# Patient Record
Sex: Female | Born: 1940 | Race: White | Hispanic: No | Smoking: Current every day smoker
Health system: Southern US, Community
[De-identification: ages and names within clinical notes are randomized; demographics above are authoritative.]

## PROBLEM LIST (undated history)

## (undated) DIAGNOSIS — E785 Hyperlipidemia, unspecified: Secondary | ICD-10-CM

## (undated) DIAGNOSIS — F329 Major depressive disorder, single episode, unspecified: Secondary | ICD-10-CM

## (undated) DIAGNOSIS — I2510 Atherosclerotic heart disease of native coronary artery without angina pectoris: Secondary | ICD-10-CM

## (undated) DIAGNOSIS — L02512 Cutaneous abscess of left hand: Secondary | ICD-10-CM

## (undated) DIAGNOSIS — E876 Hypokalemia: Secondary | ICD-10-CM

## (undated) DIAGNOSIS — I10 Essential (primary) hypertension: Secondary | ICD-10-CM

## (undated) DIAGNOSIS — F419 Anxiety disorder, unspecified: Secondary | ICD-10-CM

## (undated) DIAGNOSIS — R918 Other nonspecific abnormal finding of lung field: Secondary | ICD-10-CM

## (undated) DIAGNOSIS — F32A Depression, unspecified: Secondary | ICD-10-CM

## (undated) DIAGNOSIS — K219 Gastro-esophageal reflux disease without esophagitis: Secondary | ICD-10-CM

## (undated) DIAGNOSIS — Z8249 Family history of ischemic heart disease and other diseases of the circulatory system: Secondary | ICD-10-CM

## (undated) DIAGNOSIS — J95811 Postprocedural pneumothorax: Secondary | ICD-10-CM

## (undated) DIAGNOSIS — I272 Pulmonary hypertension, unspecified: Secondary | ICD-10-CM

## (undated) DIAGNOSIS — R3129 Other microscopic hematuria: Secondary | ICD-10-CM

## (undated) DIAGNOSIS — E039 Hypothyroidism, unspecified: Secondary | ICD-10-CM

## (undated) DIAGNOSIS — J449 Chronic obstructive pulmonary disease, unspecified: Secondary | ICD-10-CM

## (undated) HISTORY — DX: Depression, unspecified: F32.A

## (undated) HISTORY — DX: Chronic obstructive pulmonary disease, unspecified: J44.9

## (undated) HISTORY — DX: Gastro-esophageal reflux disease without esophagitis: K21.9

## (undated) HISTORY — DX: Cutaneous abscess of left hand: L02.512

## (undated) HISTORY — DX: Major depressive disorder, single episode, unspecified: F32.9

## (undated) HISTORY — DX: Hypothyroidism, unspecified: E03.9

## (undated) HISTORY — DX: Atherosclerotic heart disease of native coronary artery without angina pectoris: I25.10

## (undated) HISTORY — DX: Family history of ischemic heart disease and other diseases of the circulatory system: Z82.49

## (undated) HISTORY — DX: Other microscopic hematuria: R31.29

## (undated) HISTORY — PX: CORONARY ARTERY BYPASS GRAFT: SHX141

## (undated) HISTORY — DX: Other nonspecific abnormal finding of lung field: R91.8

## (undated) HISTORY — DX: Hyperlipidemia, unspecified: E78.5

## (undated) HISTORY — DX: Hypokalemia: E87.6

## (undated) HISTORY — DX: Essential (primary) hypertension: I10

## (undated) HISTORY — DX: Pulmonary hypertension, unspecified: I27.20

## (undated) HISTORY — PX: ABDOMINAL HYSTERECTOMY: SHX81

## (undated) HISTORY — DX: Anxiety disorder, unspecified: F41.9

---

## 2005-06-04 ENCOUNTER — Ambulatory Visit: Payer: Self-pay | Admitting: Pulmonary Disease

## 2005-06-04 ENCOUNTER — Inpatient Hospital Stay (HOSPITAL_COMMUNITY): Admission: AD | Admit: 2005-06-04 | Discharge: 2005-06-06 | Payer: Self-pay | Admitting: Neurosurgery

## 2005-06-20 ENCOUNTER — Ambulatory Visit: Payer: Self-pay | Admitting: Pulmonary Disease

## 2005-08-20 ENCOUNTER — Ambulatory Visit: Payer: Self-pay | Admitting: Pulmonary Disease

## 2005-11-21 ENCOUNTER — Ambulatory Visit: Payer: Self-pay | Admitting: Pulmonary Disease

## 2009-06-21 ENCOUNTER — Telehealth (INDEPENDENT_AMBULATORY_CARE_PROVIDER_SITE_OTHER): Payer: Self-pay | Admitting: *Deleted

## 2010-03-06 NOTE — Progress Notes (Signed)
  Phone Note Other Incoming   Request: Send information Summary of Call: Request for records received from [facility]. Request forwarded to [facility].

## 2010-06-22 NOTE — Assessment & Plan Note (Signed)
[facility]                               PULMONARY OFFICE NOTE   [name], BASIC                      MRN:          [id number]  DATE:11/21/2005                            DOB:          1940-08-07    HISTORY OF PRESENT ILLNESS:  I saw Casey Moran today in followup for her  chronic obstructive pulmonary disease, obstructive sleep apnea, and  secondary pulmonary hypertension. She says that since her last visit with  me, she had been evaluated for cause of anemia. She had undergone upper  endoscopy as well as colonoscopy 3 days prior to this visit and apparently  was found to have an oozing vessel from her stomach, although she is due to  have followup with her gastroenterologist for further discussion of this.  She has also been started on a Holter monitor for evaluation of irregular  heartbeat. She says that her breathing has been significantly worse for the  last several days, although today it seems to be getting better. She says  that she had run out of several of her medications and was waiting for her  monthly check to come in before renewing her medications and as a result of  that, she was not using her inhaler regimen as prescribed. Otherwise, again  she says that she is feeling reasonably good today with her breathing. She  is using her supplemental oxygen at night and she is using her C-PAP machine  without any difficulty. She says that when she tries using the Foradil twice  a day, every day, she felt like she had difficulty being able to fall  asleep. But she has tried using it once daily, every other day, and then  twice a day every other day and this seems to allow her to sleep a little  bit better.   CURRENT MEDICATIONS:  1. Xanax 0.5 mg q.h.s.  2. Aspirin 81 mg daily.  3. Cenestin 0.625 mg daily.  4. Fluoxetine 40 mg daily.  5. Levothyroxine 80 mcg daily.  6. Omeprazole 20 mg b.i.d.  7. Plavix 75 mg daily.  8. Simvastatin  80 mg daily.  9. Spiriva 1 puff daily.  10.Toprol XL 50 mg daily.  11.Zetia 10 mg daily.  12.Foradil inhaler b.i.d.  13.Albuterol 2 puffs twice daily.  14.Cyclobenzaprine 10 mg p.r.n.  15.Hydrocodone APAP 5/500 as needed.   PHYSICAL EXAMINATION:  VITAL SIGNS:  Weight 165 pounds. Temperature 97.9.  Blood pressure 104/62. Heart rate 76. Oxygen saturation 94% on room air.  HEENT:  No sinus tenderness. No nasal discharge. No oral lesions. No  lymphadenopathy.  HEART:  S1 and S2.  CHEST:  Decreased breath sounds but no wheezing or rales.  ABDOMEN:  Soft, nontender, and nondistended.  EXTREMITIES:  No edema.   IMPRESSION:  1. Chronic obstructive pulmonary disease. I will continue her on her      Spiriva. I will discontinue the Foradil and change her over to      Symbicort 80/4.5 two puffs b.i.d. I have demonstrated the proper use of      this inhaler. I  will also continue her on her albuterol as needed. We      have given her a sample of Symbicort.  2. Obstructive sleep apnea. I will continue her on her current setting of      C-PAP with 8 cm of water, as she appears to be doing reasonably well      with this.  3. Secondary pulmonary hypertension. I will continue to try and optimize      treatment of her chronic obstructive pulmonary disease and obstructive      sleep apnea as well as continue her on her supplement oxygen.  4. I have given her an influenza vaccination today.  5. Recent episode of dyspnea. This could be related to her missing several      days of her medications as well as possibly related to her anemia.      Again, she is due to have followup with her gastroenterologist for      further evaluation of this.  6. Irregular heart rate. She is undergoing a Holter monitor and is due to      have followup with cardiology for this.   FOLLOWUP:  My plan is to have her come back for followup evaluation in 3 to  4 months.       Coralyn Helling, MD      VS/MedQ  DD:   11/21/2005  DT:  11/24/2005  Job #:  [id number]

## 2010-06-22 NOTE — Assessment & Plan Note (Signed)
[facility]                               PULMONARY OFFICE NOTE   Casey Moran, Casey Moran                      MRN:          [id number]  DATE:08/20/2005                            DOB:          [birth year]    I saw Casey Moran today in followup for her COPD and sleep apnea with  secondary pulmonary hypertension.  She is doing quite well symptomatically.  She is on an inhaled regimen of Spiriva with p.r.n. albuterol.  She is using  her albuterol 4 to 5 times a week but she feels like she needs to use it  more often, but is somewhat reluctant to do that.  Otherwise she says that  she is able to walk on her treadmill for about 20 minutes.  She is trying to  reach a goal of doing a mile to a mile and a half a day.  She does not have  much as far as symptoms of dyspnea while doing this.  She said that she has  been having occasional symptoms of chest congestion with a cough more  recently with the increasing humidity in the air.  She has also been having  occasional symptoms of palpitations.  She does not have any other symptoms  to correlate with this.  She seems to be doing quite well on her CPAP  therapy and has no difficulty wearing this.  She is wearing this for the  entire time that she is asleep.  I have also received a copy of her medical  records from  Dr. Danella Penton and of note that she had pulmonary function tests  done on August 18, 2004 and this showed the FV1/FVC ratio 55% with a FV1 of  2.03 liters which was 81% of predicted, and FVC of 3.68 liters which is 115%  of predicted.  She had a total lung capacity of 6.29 liters which was 121%  of predicted and the residual volume of 2.6 liters which was 127% of  predicted.  Diffusion capacity of 64% of predicted.  Her baseline  flow  volume looks like she did have some degree of air flow obstruction, although  this was somewhat mild.  However, I do not see any mention of her having  pulmonary hypertension.   She has not had any significant changes in her  medications since her last visit.   PHYSICAL EXAMINATION:  VITAL SIGNS:  She is 164 pounds.  Temperature 97.9.  Blood pressure 120/78.  Heart rate is 78.  Oxygen saturation is 92%on room  air.  HEENT:  There is no sinus tenderness.  No nasal discharge.  No oral lesions.  No lymphadenopathy, no thyromegaly.  HEART:  S1 and S2.  Regular rate and rhythm.  CHEST:  Clear to auscultation.  ABDOMEN:  Soft, non-tender.  Positive bowel sounds.  EXTREMITIES:  No edema.   IMPRESSION:  1.  Chronic obstructive pulmonary disease. I will continue her on Spiriva      and in addition I will start her on Foradil 1 puff b.i.d. to see if this  helps with better symptom control.  I also advised her that she did not      need to use her Combivent anymore and that she could use albuterol on an      as needed basis as a rescue medicine.  Additionally I will have her      undergo an overnight oximetry to see if she does in fact need continued      use of nocturnal oxygen.  2.  Obstructive sleep apnea - currently doing well on continuous positive      airway pressure of 8 cm of water.  I have encouraged her to maintain her      compliance with this and will continue her on her current settings.  3.  Possible secondary pulmonary hypertension.  I have asked her to forward      copies of her most recent cardiac catheterization and echocardiogram to      further assess this.  Additionally I will have her undergo an overnight      oximetry with the main goal being to determine if she in fact needs to      remain on supplemental oxygen.  4.  I plan on calling her with the results of the overnight oximetry as well      as assessing her tolerance of Foradil.  I plan on following up with her      in the office in about three months.                                   Coralyn Helling, MD   VS/MedQ  DD:  08/20/2005  DT:  08/20/2005  Job #:  [id number]

## 2010-06-22 NOTE — Discharge Summary (Signed)
Casey Moran, Casey Moran               ACCOUNT NO.:  [id number]   MEDICAL RECORD NO.:  [id number]          PATIENT TYPE:  INP   LOCATION:  3001                         FACILITY:  [facility]   PHYSICIAN:  Danae Orleans. Venetia Maxon, M.D.  DATE OF BIRTH:  April 09, 1940   DATE OF ADMISSION:  06/04/2005  DATE OF DISCHARGE:  06/06/2005                                 DISCHARGE SUMMARY   DATE OF ADMISSION:  Jun 04, 2005   DATE OF DISCHARGE:  Jun 06, 2005   REASON FOR ADMISSION:  1. Lumbar disc displacement.  2. Chronic airway obstructive disease.  3. Lumbar disc degeneration.  4. Lumbosacral spondylosis.  5. Coronary arthrosclerosis.  6. Aortic coronary bypass.  7. History of PENICILLIN ALLERGY.  8. History of SULFONAMIDE ALLERGY.  9. Obstructive sleep apnea.  10.History of tobacco use.  11.Esophageal reflux.  12.Chronic pulmonary heart disease.  13.Long term use of aspirin,  14.Long term use of antiplatelet and antithrombotic agents.   FINAL DIAGNOSES:  1. Lumbar disc displacement.  2. of destructive disease.  3. Lumbar disc degeneration.  4. Lumbosacral spondylosis.  5. Coronary arthrosclerosis.  6. Aortic coronary bypass.  7. History of PENICILLIN ALLERGY.  8. History of  ALLERGY.  9. Obstructive sleep apnea.  10.History of tobacco use.  11.Esophageal reflux.  12.Chronic pulmonary heart disease.  13.Long term use of aspirin,  14.Long term use of antiplatelet and antithrombotic agents.   HISTORY OF ILLNESS AND THE HOSPITAL COURSE:  Casey Moran is a 70 year old  woman with 10/10 pain in her left leg, which she describes as very severe.  This began in September of 2006.  I saw her in the office on May 24, 2005.  She was found to have a large extruded fragment of herniated disc at L4-5 on  the left and it was elected to admit her to the hospital for left L4-5  microdiskectomy, which procedure was performed on Jun 04, 2005.  Postoperatively, she had full strength in her lower extremities, was  gradually mobilized, but had wheezing and 93% room air saturation.  She was  seen by the critical care medicine service because of her COPD and poor  oxygen saturation on room air of 85%.  She was doing better on the 23rd with  discharge home on that with home CPAP, continuing her Spiriva, albuterol as  needed with instructions to follow up with a pulmonologist on Jun 20, 2005.  She will follow up with me in the office in 3-4 weeks.      Danae Orleans. Venetia Maxon, M.D.  Electronically Signed     JDS/MEDQ  D:  09/26/2005  T:  09/27/2005  Job:  [id number]

## 2010-06-22 NOTE — Op Note (Signed)
NAMEBRIENA, Casey Moran               ACCOUNT NO.:  [id number]   MEDICAL RECORD NO.:  [id number]          PATIENT TYPE:  AMB   LOCATION:  SDS                          FACILITY:  [facility]   PHYSICIAN:  Danae Orleans. Venetia Maxon, M.D.  DATE OF BIRTH:  1940/12/04   DATE OF PROCEDURE:  06/04/2005  DATE OF DISCHARGE:                                 OPERATIVE REPORT   PREOPERATIVE DIAGNOSIS:  Left L4-5 herniated disk with spondylosis,  degenerative disk disease, and radiculopathy.   POSTOPERATIVE DIAGNOSIS:  Left L4-5 herniated disk with spondylosis,  degenerative disk disease, and radiculopathy.   PROCEDURE:  Left L4-5 microdiskectomy with microdissection.   SURGEON:  Danae Orleans. Venetia Maxon, M.D.   ASSISTANT:  Coletta Memos, M.D.   ANESTHESIA:  General endotracheal anesthesia.   ESTIMATED BLOOD LOSS:  Minimal.   COMPLICATIONS:  None.   DISPOSITION:  To recovery room.   INDICATIONS FOR PROCEDURE:  Casey Moran is a 70 year old woman with left  leg pain, low back pain, and a large disk herniation at the L4-5 level.  It  was elected to take her to surgery for microdiskectomy.   DESCRIPTION OF PROCEDURE:  [name] was brought to the operating room.  Following the satisfactory and uncomplicated induction of general  endotracheal anesthesia and placement of intravenous lines, the patient was  placed in a prone position on the operating table on the Wilson frame.  Her  back was prepped and draped in the usual sterile fashion after area of plain  incision was infiltrated with 0.25% Marcaine and 0.5% lidocaine with  1:200,000 epinephrine.  Incision was made in the midline and carried through  adipose.  The lumbodorsal fascia was incised in the left side of the  midline.  Subperiosteal dissection was performed exposing the L4-5  interspace.  Self-retaining retractor was placed to facilitate exposure.  Intraoperative x-ray confirmed marker probe at the L4-5 level.  Subsequently, hemisemilaminectomy of L4  was performed with the high speed  drill and completed with Kerrison rongeurs and a foraminotomy was performed  overlying the superior aspect of the L5 lamina and over the foraminotomy  overlying the L5 nerve root.  Microscope was brought into the field and  using microscopic visualization and microdissection technique, the thecal  sac and L5 nerve root were mobilized medially.  A large free fragment of  herniated disk material was identified and this was removed in one large  piece, measuring approximately 3 cm in length x 1.5 cm in width from under  the nerve root and thecal sac.  Two additional much smaller pieces were also  removed and the floor of the canal was palpated.  The annulus appeared to be  intact and consequently was not incised and the medial aspect of the canal  also appeared to be well decompressed.  There did not appear to be any  residual compression under the L5 nerve root or displacing this nerve root.  Hemostasis was assured with Gelfoam soaked in thrombin and subsequently the  operative site was bathed in 2 mL of Fentanyl and 80 mg of Depo-Medrol.  The  self-retaining retractor  was removed, the microscope was taken out of the  field, lumbodorsal fascia was closed with 0 Vicryl suture.  The subcutaneous  tissues were reapproximated with 2-0 Vicryl interrupted inverted sutures and  the skin edges were reapproximated with interrupted 3-0 Vicryl subcuticular  stitch.  The wound was dressed with Dermabond.  The patient was extubated in  the operating room and taken to the recovery room in stable satisfactory  condition having tolerated the operation well.  Needle, sponge, and  instrument counts correct at the end of the case.      Danae Orleans. Venetia Maxon, M.D.  Electronically Signed     JDS/MEDQ  D:  06/04/2005  T:  06/05/2005  Job:  [id number]

## 2016-05-07 ENCOUNTER — Other Ambulatory Visit: Payer: Self-pay | Admitting: *Deleted

## 2016-05-07 ENCOUNTER — Institutional Professional Consult (permissible substitution) (INDEPENDENT_AMBULATORY_CARE_PROVIDER_SITE_OTHER): Payer: Medicare Other | Admitting: Cardiothoracic Surgery

## 2016-05-07 VITALS — BP 129/62 | HR 79 | Resp 20 | Ht 65.5 in | Wt 128.0 lb

## 2016-05-07 DIAGNOSIS — J984 Other disorders of lung: Secondary | ICD-10-CM | POA: Diagnosis not present

## 2016-05-07 DIAGNOSIS — R918 Other nonspecific abnormal finding of lung field: Secondary | ICD-10-CM

## 2016-05-07 NOTE — Progress Notes (Signed)
[address]       [place] [address]             [phone]                    Casey Moran [facility] Medical Record #[id number] Date of Birth: September 09, 1940  Referring: Bethanie Dicker, MD Primary Care: Elijio Miles, MD  Chief Complaint:    Chief Complaint  Patient presents with  . Lung Mass    Sugical eval, PET Scan 04/12/16, Chest CT 03/26/16, MRI Brain 04/15/16    History of Present Illness:    Casey Moran 76 y.o. female is seen in the office  today for valuation of left upper lobe lung lesion. Patient had previously had a lung cancer screening CT done at [facility] 10/20/2014, reported at that time was negative lung cancer screening CT evidence of emphysema. 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 Pricilla Holm in 2000. She was being evaluated for tinnitus and unsteadiness on her feet. A chest x-ray demonstrated questionable left lung mass a CT scan done February 26 03/26/2016 at [facility] showed a new irregular parenchymal opacity left upper lobe measuring 3.7 x 2.1 x 1.8 cm. A PET scan was performed and the patient referred to thoracic surgery for evaluation.    Current Activity/ Functional Status:  Patient is independent with mobility/ambulation, transfers, ADL's, IADL's.   Zubrod Score: At the time of surgery this patient's most appropriate activity status/level should be described as: []     0    Normal activity, no symptoms [x]     1    Restricted in physical strenuous activity but ambulatory, able to do out light work []     2    Ambulatory and capable of self care, unable to do work activities, up and about               >50 % of waking hours                              []     3    Only limited self care, in bed greater than 50% of waking hours []     4    Completely disabled, no self care, confined to bed or chair []     5    Moribund   Past  Medical History:  Diagnosis Date  . Abscess of finger of left hand   . Anxiety   . ASCVD (arteriosclerotic cardiovascular disease)   . CAD (coronary artery disease)   . COPD (chronic obstructive pulmonary disease) (HCC)   . Depression   . Family history of hereditary hemorrhagic telangiectasia (HHT)   . GERD (gastroesophageal reflux disease)   . Hematuria, microscopic   . Hyperlipemia   . Hypertension   . Hypokalemia   . Hypothyroidism   . Lung mass   . Pulmonary hypertension     Past Surgical History:  Procedure Laterality Date  . ABDOMINAL HYSTERECTOMY    . CORONARY ARTERY BYPASS GRAFT      cardiology/Dr Roarbach     No family history on file.  Social History   Social History  . Marital status: Widowed    Spouse name: N/A  . Number of children: N/A  . Years of education: N/A   Occupational History  . Not  on file.   Social History Main Topics  . Smoking status: Current Every Day Smoker    Types: Cigarettes  . Smokeless tobacco: Never Used  . Alcohol use No  . Drug use: No  . Sexual activity: No   Other Topics Concern  . Not on file   Social History Narrative  . No narrative on file    History  Smoking Status  . Current Every Day Smoker  . Types: Cigarettes  Smokeless Tobacco  . Never Used    History  Alcohol Use No     Allergies  Allergen Reactions  . Bupropion Tinitus  . Opium Palpitations and Other (See Comments)    Increased heart rate  . Azithromycin Rash  . Ciprofloxacin Other (See Comments)    Bruising  . Penicillins   . Sulfa Antibiotics   . Amoxicillin Rash  . Clindamycin/Lincomycin Rash  . Codeine Phosphate [Codeine] Rash  . Penicillin G Rash  . Sulfamethoxazole Rash    Current Outpatient Prescriptions  Medication Sig Dispense Refill  . clopidogrel (PLAVIX) 75 MG tablet TAKE 1 TABLET BY MOUTH DAILY    . levothyroxine (SYNTHROID, LEVOTHROID) 88 MCG tablet TAKE 1 TABLET EVERY DAY  AT  6AM    . lubiprostone  (AMITIZA) 24 MCG capsule Take by mouth.    . nitroGLYCERIN (NITROSTAT) 0.4 MG SL tablet PLACE ONE TABLET UNDER THE TONGUE EVERY FIVE MINUTES AS NEEDED FOR CHEST PAIN AS DIRECTED    . Tiotropium Bromide Monohydrate (SPIRIVA RESPIMAT) 2.5 MCG/ACT AERS Inhale into the lungs.    Marland Kitchen acetaminophen (TYLENOL) 325 MG tablet Take 325 mg by mouth.    Marland Kitchen albuterol (PROVENTIL HFA;VENTOLIN HFA) 108 (90 Base) MCG/ACT inhaler Inhale into the lungs.    . ALPRAZolam (XANAX) 1 MG tablet     . aspirin EC 81 MG tablet Take 81 mg by mouth.    Marland Kitchen atorvastatin (LIPITOR) 80 MG tablet Take by mouth.    . budesonide-formoterol (SYMBICORT) 160-4.5 MCG/ACT inhaler Inhale into the lungs.    . Calcium Carb-Ergocalciferol 500-200 MG-UNIT TABS Take by mouth.    . Cholecalciferol (VITAMIN D3) 1000 units CAPS Take by mouth.    . cyclobenzaprine (FLEXERIL) 10 MG tablet Take by mouth.    Marland Kitchen FLUoxetine (PROZAC) 40 MG capsule Take by mouth.    . levocetirizine (XYZAL) 5 MG tablet Take 5 mg by mouth.    . Levothyroxine Sodium 88 MCG CAPS Take by mouth.    . metoprolol succinate (TOPROL-XL) 50 MG 24 hr tablet     . omeprazole (PRILOSEC) 40 MG capsule     . tiZANidine (ZANAFLEX) 4 MG tablet      No current facility-administered medications for this visit.       Review of Systems:     Cardiac Review of Systems: Y or N  Chest Pain [  n  ]  Resting SOB [ n  ] Exertional SOB  [ y ]  Orthopnea [ y ]   Pedal Edema [  n ]    Palpitations [n  ] Syncope  [n  ]   Presyncope [n   ]  General Review of Systems: [Y] = yes [  ]=no Constitional: recent weight change [  ];  Wt loss over the last 3 months [   ] anorexia [  ]; fatigue [  ]; nausea [  ]; night sweats [n  ]; fever [  ]; or chills [ n ];  Dental: poor dentition[  ]; Last Dentist visit:   Eye : blurred vision [  ]; diplopia [   ]; vision changes [  ];  Amaurosis fugax[  ]; Resp: cough [  ];  wheezing[ y ];  hemoptysis[n  ]; shortness of breath[y  ]; paroxysmal nocturnal  dyspnea[ y ]; dyspnea on exertion[ y ]; or orthopnea[  ];  GI:  gallstones[  ], vomiting[  ];  dysphagia[  ]; melena[  ];  hematochezia [  ]; heartburn[  ];   Hx of  Colonoscopy[  ]; GU: kidney stones [  ]; hematuria[  ];   dysuria [  ];  nocturia[  ];  history of     obstruction [  ]; urinary frequency [  ]             Skin: rash, swelling[  ];, hair loss[  ];  peripheral edema[  ];  or itching[  ]; Musculosketetal: myalgias[  ];  joint swelling[  ];  joint erythema[  ];  joint pain[  ];  back pain[  ];  Heme/Lymph: bruising[  ];  bleeding[  ];  anemia[  ];  Neuro: TIA[  ];  headaches[  ];  stroke[  ];  vertigo[  ];  seizures[  ];   paresthesias[  ];  difficulty walking[  ];  Psych:depression[  ]; anxiety[  ];  Endocrine: diabetes[n  ];  thyroid dysfunction[  ];  Immunizations: Flu up to date [place]ienne ]; Pneumococcal up to date [ y ];  Other:  Physical Exam: BP 129/62   Pulse 79   Resp 20   Ht 5' 5.5" (1.664 m)   Wt 128 lb (58.1 kg)   SpO2 91% Comment: RA  BMI 20.98 kg/m   PHYSICAL EXAMINATION: General appearance: alert, cooperative and appears older than stated age Head: Normocephalic, without obvious abnormality, atraumatic Neck: no adenopathy, no carotid bruit, no JVD, supple, symmetrical, trachea midline and thyroid not enlarged, symmetric, no tenderness/mass/nodules Lymph nodes: Cervical, supraclavicular, and axillary nodes normal. Resp: clear to auscultation bilaterally Back: symmetric, no curvature. ROM normal. No CVA tenderness. Cardio: regular rate and rhythm, S1, S2 normal, no murmur, click, rub or gallop GI: soft, non-tender; bowel sounds normal; no masses,  no organomegaly Extremities: extremities normal, atraumatic, no cyanosis or edema Neurologic: Grossly normal  Diagnostic Studies & Laboratory data:     Recent Radiology Findings:    1. Malignant range FDG uptake is associated with the 1.5 x 0.7 cm indeterminate nodular density within the left upper lobe. In  this patient who is at increased risk for bronchogenic carcinoma cannot rule out primary pulmonary neoplasm. Advise correlation with recent screening chest CT and tissue sampling. 2. Subcentimeter prevascular lymph node exhibits low level, nonspecific increased FDG uptake. 3. Aortic atherosclerosis and coronary artery calcification status post CABG procedure. 4. Emphysema.   Electronically Signed ByMinette Brine M.D. On: 04/12/2016 10:37  Result Narrative  CLINICAL DATA:Initial treatment strategy for lung mass.  EXAM: NUCLEAR MEDICINE PET SKULL BASE TO THIGH  TECHNIQUE: 18.0 mCi F-18 FDG was injected intravenously. Full-ring PET imaging was performed from the skull base to thigh after the radiotracer. CT data was obtained and used for attenuation correction and anatomic localization.  FASTING BLOOD GLUCOSE:Value: 91 mg/dl  COMPARISON:CT chest from 11/03/11  FINDINGS: NECK  No hypermetabolic lymph nodes in the neck.  CHEST  Moderate changes of centrilobular emphysema. Indeterminate nodular density within the left upper lobe measures 1.5 x 0.7 cm, image 80 of  series 3. There is a malignant range FDG uptake on the corresponding PET images within SUV max equal to 2.38.  Moderate cardiac enlargement. There is aortic atherosclerosis and coronary artery calcifications. Previous CABG procedure. There is a pre-vascular lymph node adjacent to the descending thoracic aorta which measures 8 mm and has an SUV max equal to 2.04.  ABDOMEN/PELVIS  No abnormal hypermetabolic activity within the liver, pancreas, adrenal glands, or spleen. No hypermetabolic lymph nodes in the abdomen or pelvis.  SKELETON  No focal hypermetabolic activity to suggest skeletal metastasis.     Cardiology workup  No significant ST segment changes or arrhythmias were noted during  stress  1. Unremarkable baseline EKG. 2. Well-tolerated Lexiscan cardiac stress administration. 3. No  EKG changes suggestive of ischemia. 4. Nuclear perfusion image results will be interpreted by Radiology and  will appear in a separate report.  Tollie Pizza, DO [facility] Interventional Cardiology and Peripheral Vascular Interventions [facility] 1/9/201811:52 AM  1. No reversible ischemia or infarction.  2. Normal left ventricular wall motion.  3. Left ventricular ejection fraction is 68%.  4. Non invasive risk stratification*: Low  *2012 Appropriate Use Criteria for Coronary Revascularization Focused Update: J Am Coll Cardiol. 2012;59(9):857-881. http://content.dementiazones.com.aspx?articleid=1201161   Electronically Signed ByGlean Hess M.D. On: 02/13/2016 15:43   This study shows minimal small vessel ischemic change and mild cortical atrophy possibly within normal limits at this patient's age. 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 There is no abnormal enhancement.  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   FINDINGS:  Calvarium/skull base: No focal marrow replacing lesion suggestive of neoplasm. Orbits: Grossly unremarkable. Paranasal sinuses: Imaged portions clear. There is however extensive increased T2 signal involving both the right and left mastoid. Brain: The cerebellum shows no abnormal signal change. The fourth ventricle is of normal size with no displacement. The brainstem structures are well outlined showing no abnormal signal. The 7th and 8th nerve complex and 5th cranial nerve  are identified and unremarkable. Flow void in the vertebrobasilar and anterior circulation is identified and unremarkable. The third and lateral ventricles are of normal size with no displacement. There are several areas of hyperintense T2 signal seen within the white matter of either hemisphere. There is generalized cortical atrophy.  Following injection of contrast material no abnormal enhancement is identified.  Additional comments: None.   Cardiology workup  No significant ST segment changes or arrhythmias were noted during  stress  1. Unremarkable baseline EKG. 2. Well-tolerated Lexiscan cardiac stress administration. 3. No EKG changes suggestive of ischemia. 4. Nuclear perfusion image results will be interpreted by Radiology and  will appear in a separate report.  Tollie Pizza, DO [facility] Interventional Cardiology and Peripheral Vascular Interventions [facility] 1/9/201811:52 AM  1. No reversible ischemia or infarction.  2. Normal left ventricular wall motion.  3. Left ventricular ejection fraction is 68%.  4. Non invasive risk stratification*: Low  *2012 Appropriate Use Criteria for Coronary Revascularization Focused Update: J Am Coll Cardiol. 2012;59(9):857-881. http://content.dementiazones.com.aspx?articleid=1201161   Electronically Signed ByGlean Hess M.D. On: 02/13/2016 15:43  Recent Lab Findings: No results found for: WBC, HGB, HCT, PLT, GLUCOSE, CHOL, TRIG, HDL, LDLDIRECT, LDLCALC, ALT, AST, NA, K, CL, CREATININE, BUN, CO2, TSH, INR, GLUF, HGBA1C    Assessment / Plan:   New left upper lobe lung mass, hypermetabolic- on review of the films between the original CT scan 04/01/2016 and the subsequent PET scan 04/12/2016, the lesion questions appears to have shrunk from 3.7 cm total maximum size of 1.5 cm if the scans are accurate. I reviewed these findings with the patient and have recommended that we proceed with Repeat CT scan of the chest  tube are D for possible navigation bronchoscopy depending on the findings- if in fact this lesion has shrunk dramatically in size and may not represent a malignant lesion but infectious process but with her high risk for development of lung cancer dyspnea to be followed closely. Obtain a full set of pulmonary function studies I've encouraged her to stop smoking    Patient will return to the office following the super D CT of the chest follow-up  I  spent 40 minutes counseling the patient face to face and 50% or more the  time was spent in counseling and coordination of care. The total time spent in the appointment was 60 minutes.  Delight Ovens MD      [address] [place] [place] [address] Office [phone]   Beeper [phone]  05/09/2016 5:22 PM

## 2016-05-10 ENCOUNTER — Ambulatory Visit
Admission: RE | Admit: 2016-05-10 | Discharge: 2016-05-10 | Disposition: A | Discharge status: Home / Self Care | Payer: Medicare Other | Source: Ambulatory Visit | Attending: Cardiothoracic Surgery | Admitting: Cardiothoracic Surgery

## 2016-05-10 ENCOUNTER — Ambulatory Visit (HOSPITAL_COMMUNITY)
Admission: RE | Admit: 2016-05-10 | Discharge: 2016-05-10 | Disposition: A | Discharge status: Home / Self Care | Payer: Medicare Other | Source: Ambulatory Visit | Attending: Cardiothoracic Surgery | Admitting: Cardiothoracic Surgery

## 2016-05-10 DIAGNOSIS — J449 Chronic obstructive pulmonary disease, unspecified: Secondary | ICD-10-CM | POA: Insufficient documentation

## 2016-05-10 DIAGNOSIS — R918 Other nonspecific abnormal finding of lung field: Secondary | ICD-10-CM | POA: Diagnosis not present

## 2016-05-10 LAB — PULMONARY FUNCTION TEST
DL/VA % pred: 52 %
DL/VA: 2.59 ml/min/mmHg/L
DLCO unc % pred: 42 %
DLCO unc: 10.97 ml/min/mmHg
FEF 25-75 Post: 0.5 L/sec
FEF 25-75 Pre: 0.39 L/sec
FEF2575-%Change-Post: 27 %
FEF2575-%Pred-Post: 28 %
FEF2575-%Pred-Pre: 22 %
FEV1-%Change-Post: 8 %
FEV1-%Pred-Post: 75 %
FEV1-%Pred-Pre: 69 %
FEV1-Post: 1.7 L
FEV1-Pre: 1.57 L
FEV1FVC-%Change-Post: 4 %
FEV1FVC-%Pred-Pre: 66 %
FEV6-%Change-Post: 7 %
FEV6-%Pred-Post: 98 %
FEV6-%Pred-Pre: 92 %
FEV6-Post: 2.82 L
FEV6-Pre: 2.62 L
FEV6FVC-%Change-Post: 3 %
FEV6FVC-%Pred-Post: 90 %
FEV6FVC-%Pred-Pre: 87 %
FVC-%Change-Post: 3 %
FVC-%Pred-Post: 108 %
FVC-%Pred-Pre: 105 %
FVC-Post: 3.26 L
FVC-Pre: 3.15 L
Post FEV1/FVC ratio: 52 %
Post FEV6/FVC ratio: 86 %
Pre FEV1/FVC ratio: 50 %
Pre FEV6/FVC Ratio: 83 %
RV % pred: 116 %
RV: 2.7 L
TLC % pred: 116 %
TLC: 6.07 L

## 2016-05-10 MED ORDER — ALBUTEROL SULFATE (2.5 MG/3ML) 0.083% IN NEBU
2.5000 mg | INHALATION_SOLUTION | Freq: Once | RESPIRATORY_TRACT | Status: AC
  Administered 2016-05-10: 2.5 mg via RESPIRATORY_TRACT

## 2016-05-14 ENCOUNTER — Ambulatory Visit (INDEPENDENT_AMBULATORY_CARE_PROVIDER_SITE_OTHER): Payer: Medicare Other | Admitting: Cardiothoracic Surgery

## 2016-05-14 ENCOUNTER — Encounter: Payer: Self-pay | Admitting: Cardiothoracic Surgery

## 2016-05-14 VITALS — BP 134/73 | HR 68 | Resp 20 | Ht 65.5 in | Wt 128.0 lb

## 2016-05-14 DIAGNOSIS — J449 Chronic obstructive pulmonary disease, unspecified: Secondary | ICD-10-CM

## 2016-05-14 DIAGNOSIS — R942 Abnormal results of pulmonary function studies: Secondary | ICD-10-CM | POA: Insufficient documentation

## 2016-05-14 DIAGNOSIS — J984 Other disorders of lung: Secondary | ICD-10-CM

## 2016-05-14 DIAGNOSIS — R918 Other nonspecific abnormal finding of lung field: Secondary | ICD-10-CM | POA: Diagnosis not present

## 2016-05-14 NOTE — Progress Notes (Signed)
[address]       [place] [address]             [phone]                    Casey Moran [facility] Medical Record #[id number] Date of Birth: 1940/09/04  Referring: Bethanie Dicker, MD Primary Care: Elijio Miles, MD  Chief Complaint:    Chief Complaint  Patient presents with  . Lung Mass    f/u to discuss Chest CT- Super D 05/13/16 and PFT's 05/10/16    History of Present Illness:    Casey Moran 76 y.o. female is seen in the office  today for evaluation of left upper lobe lung lesion. Patient had previously had a lung cancer screening CT done at [facility] 10/20/2014, reported at that time was negative lung cancer screening CT evidence of emphysema. 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 Pricilla Holm in 2000. She was being evaluated for tinnitus and unsteadiness on her feet. A chest x-ray demonstrated questionable left lung mass a CT scan done February 26 03/26/2016 at [facility] showed a new irregular parenchymal opacity left upper lobe measuring 3.7 x 2.1 x 1.8 cm. A PET scan was performed and the patient referred to thoracic surgery for evaluation.  The patient was previously seen it was noted that the left upper lobe lung lesion had significantly decreased in size between the original CT scan and the PET scan. A follow-up CT scan now 2 months after the original was done and the patient returns today to review the findings.  She has significantly cut back on her smoking, but is still smoking 1-2 cigarettes a day   Current Activity/ Functional Status:  Patient is independent with mobility/ambulation, transfers, ADL's, IADL's.   Zubrod Score: At the time of surgery this patient's most appropriate activity status/level should be described as: []     0    Normal activity, no symptoms [x]     1    Restricted in physical strenuous activity but ambulatory,  able to do out light work []     2    Ambulatory and capable of self care, unable to do work activities, up and about               >50 % of waking hours                              []     3    Only limited self care, in bed greater than 50% of waking hours []     4    Completely disabled, no self care, confined to bed or chair []     5    Moribund   Past Medical History:  Diagnosis Date  . Abscess of finger of left hand   . Anxiety   . ASCVD (arteriosclerotic cardiovascular disease)   . CAD (coronary artery disease)   . COPD (chronic obstructive pulmonary disease) (HCC)   . Depression   . Family history of hereditary hemorrhagic telangiectasia (HHT)   . GERD (gastroesophageal reflux disease)   . Hematuria, microscopic   . Hyperlipemia   . Hypertension   . Hypokalemia   . Hypothyroidism   . Lung mass   . Pulmonary hypertension     Past Surgical History:  Procedure  Laterality Date  . ABDOMINAL HYSTERECTOMY    . CORONARY ARTERY BYPASS GRAFT     [facility]/Dr Roarbach     No family history on file.  Social History   Social History  . Marital status: Widowed    Spouse name: N/A  . Number of children: N/A  . Years of education: N/A   Occupational History  . Not on file.   Social History Main Topics  . Smoking status: Current Every Day Smoker    Types: Cigarettes  . Smokeless tobacco: Never Used  . Alcohol use No  . Drug use: No  . Sexual activity: No   Other Topics Concern  . Not on file   Social History Narrative  . No narrative on file    History  Smoking Status  . Current Every Day Smoker  . Types: Cigarettes  Smokeless Tobacco  . Never Used    History  Alcohol Use No     Allergies  Allergen Reactions  . Bupropion Tinitus  . Opium Palpitations and Other (See Comments)    Increased heart rate  . Azithromycin Rash  . Ciprofloxacin Other (See Comments)    Bruising  . Penicillins   . Sulfa Antibiotics   . Amoxicillin Rash  .  Clindamycin/Lincomycin Rash  . Codeine Phosphate [Codeine] Rash  . Penicillin G Rash  . Sulfamethoxazole Rash    Current Outpatient Prescriptions  Medication Sig Dispense Refill  . acetaminophen (TYLENOL) 325 MG tablet Take 325 mg by mouth.    Marland Kitchen albuterol (PROVENTIL HFA;VENTOLIN HFA) 108 (90 Base) MCG/ACT inhaler Inhale into the lungs.    . ALPRAZolam (XANAX) 1 MG tablet     . aspirin EC 81 MG tablet Take 81 mg by mouth.    Marland Kitchen atorvastatin (LIPITOR) 80 MG tablet Take by mouth.    . budesonide-formoterol (SYMBICORT) 160-4.5 MCG/ACT inhaler Inhale into the lungs.    . Calcium Carb-Ergocalciferol 500-200 MG-UNIT TABS Take by mouth.    . Cholecalciferol (VITAMIN D3) 1000 units CAPS Take by mouth.    . clopidogrel (PLAVIX) 75 MG tablet TAKE 1 TABLET BY MOUTH DAILY    . FLUoxetine (PROZAC) 40 MG capsule Take by mouth.    . levocetirizine (XYZAL) 5 MG tablet Take 5 mg by mouth.    . levothyroxine (SYNTHROID, LEVOTHROID) 88 MCG tablet TAKE 1 TABLET EVERY DAY  AT  6AM    . Levothyroxine Sodium 88 MCG CAPS Take by mouth.    . lubiprostone (AMITIZA) 24 MCG capsule Take by mouth.    . metoprolol succinate (TOPROL-XL) 50 MG 24 hr tablet     . nitroGLYCERIN (NITROSTAT) 0.4 MG SL tablet PLACE ONE TABLET UNDER THE TONGUE EVERY FIVE MINUTES AS NEEDED FOR CHEST PAIN AS DIRECTED    . omeprazole (PRILOSEC) 40 MG capsule     . Tiotropium Bromide Monohydrate (SPIRIVA RESPIMAT) 2.5 MCG/ACT AERS Inhale into the lungs.     No current facility-administered medications for this visit.       Review of Systems:     Cardiac Review of Systems: Y or N  Chest Pain [  n  ]  Resting SOB [ n  ] Exertional SOB  [ y ]  Orthopnea [ y ]   Pedal Edema [  n ]    Palpitations [n  ] Syncope  [n  ]   Presyncope [n   ]  General Review of Systems: [Y] = yes [  ]=no Constitional: recent weight change [  ];  Wt loss over the last 3 months [   ] anorexia [  ]; fatigue [  ]; nausea [  ]; night sweats [n  ]; fever [  ]; or chills  [ n ];          Dental: poor dentition[  ]; Last Dentist visit:   Eye : blurred vision [  ]; diplopia [   ]; vision changes [  ];  Amaurosis fugax[  ]; Resp: cough [  ];  wheezing[ y ];  hemoptysis[n  ]; shortness of breath[y  ]; paroxysmal nocturnal dyspnea[ y ]; dyspnea on exertion[ y ]; or orthopnea[  ];  GI:  gallstones[  ], vomiting[  ];  dysphagia[  ]; melena[  ];  hematochezia [  ]; heartburn[  ];   Hx of  Colonoscopy[  ]; GU: kidney stones [  ]; hematuria[  ];   dysuria [  ];  nocturia[  ];  history of     obstruction [  ]; urinary frequency [  ]             Skin: rash, swelling[  ];, hair loss[  ];  peripheral edema[  ];  or itching[  ]; Musculosketetal: myalgias[  ];  joint swelling[  ];  joint erythema[  ];  joint pain[  ];  back pain[  ];  Heme/Lymph: bruising[  ];  bleeding[  ];  anemia[  ];  Neuro: TIA[  ];  headaches[  ];  stroke[  ];  vertigo[  ];  seizures[  ];   paresthesias[  ];  difficulty walking[  ];  Psych:depression[  ]; anxiety[  ];  Endocrine: diabetes[n  ];  thyroid dysfunction[  ];  Immunizations: Flu up to date [place].Etienne ]; Pneumococcal up to date [ y ];  Other:  Physical Exam: BP 134/73   Pulse 68   Resp 20   Ht 5' 5.5" (1.664 m)   Wt 128 lb (58.1 kg)   SpO2 95% Comment: RA  BMI 20.98 kg/m   PHYSICAL EXAMINATION: General appearance: alert, cooperative and appears older than stated age Head: Normocephalic, without obvious abnormality, atraumatic Neck: no adenopathy, no carotid bruit, no JVD, supple, symmetrical, trachea midline and thyroid not enlarged, symmetric, no tenderness/mass/nodules Lymph nodes: Cervical, supraclavicular, and axillary nodes normal. Resp: clear to auscultation bilaterally Back: symmetric, no curvature. ROM normal. No CVA tenderness. Cardio: regular rate and rhythm, S1, S2 normal, no murmur, click, rub or gallop GI: soft, non-tender; bowel sounds normal; no masses,  no organomegaly Extremities: extremities normal, atraumatic, no cyanosis  or edema Neurologic: Grossly normal  Diagnostic Studies & Laboratory data:     Recent Radiology Findings:  Ct Super D Chest Wo Contrast  Result Date: 05/10/2016 CLINICAL DATA:  Preoperative evaluation for lung mass. EXAM: CT CHEST WITHOUT CONTRAST TECHNIQUE: Multidetector CT imaging of the chest was performed using thin slice collimation for electromagnetic bronchoscopy planning purposes, without intravenous contrast. COMPARISON:  PET-CT 04/12/2016. By report, the patient had a chest CT on 03/26/2016, but this study is not available for review. FINDINGS: Cardiovascular: The heart size is normal. No pericardial effusion. Patient is status post CABG. Atherosclerotic calcification is noted in the wall of the thoracic aorta. Mediastinum/Nodes: Scattered small mediastinal lymph nodes. No mediastinal lymphadenopathy. No evidence for gross hilar lymphadenopathy although assessment is limited by the lack of intravenous contrast on today's study. The esophagus has normal imaging features. There is no axillary lymphadenopathy. Lungs/Pleura: The anterior left upper lobe pulmonary nodule seen  on the PET-CT at 1.5 x 0.7 cm has become much less confluent in the interval. The area now appears more in keeping with scarring/architectural distortion than a discrete nodule. Reports in EPIC suggest the nodule have decreased in size between 03/26/2016 and 04/12/2016 and findings on today's study would suggest further continued resolution. Centrilobular emphysema noted. Interval development of a 1.0 x 1.9 cm left lower lobe pulmonary nodule (image 84 series 5) with some adjacent tiny pulmonary nodules. Scarring the inferior lingula is stable. 3 mm posterior right lower lobe pulmonary nodule (image 89 series 5) was not definitely seen on the previous study. No pulmonary edema or pleural effusion. Upper Abdomen: 13 mm low-density lesion lateral segment left liver was present on a study from 11/03/2011 Musculoskeletal: Bone windows  reveal no worrisome lytic or sclerotic osseous lesions. IMPRESSION: 1. Anterior left upper lobe pulmonary nodule in question has become less confluent in the interval and today is more suggestive of architectural distortion/scarring than a discrete nodule. Clinic notes indicate that the nodule decreased in size between a study of 03/26/2016 (unavailable today) and the PET-CT of 04/12/2016. Continued further resolution today would be much more suggestive of an infectious/ inflammatory etiology than neoplasm. 2. Interval development of a 1.0 x 1.9 cm left lower lobe pulmonary nodule with adjacent tiny satellite nodules. The rapid interval appearance of this nodule along with a satellite nodules is more suggestive of infection than neoplasm. Follow-up could be used to assess for resolution. 3. 3 mm posterior right lower lobe pulmonary nodule could also be reassessed at the time of followup. 4. Emphysema. Electronically Signed   By: Kennith Center M.D.   On: 05/10/2016 14:45       1. Malignant range FDG uptake is associated with the 1.5 x 0.7 cm indeterminate nodular density within the left upper lobe. In this patient who is at increased risk for bronchogenic carcinoma cannot rule out primary pulmonary neoplasm. Advise correlation with recent screening chest CT and tissue sampling. 2. Subcentimeter prevascular lymph node exhibits low level, nonspecific increased FDG uptake. 3. Aortic atherosclerosis and coronary artery calcification status post CABG procedure. 4. Emphysema.   Electronically Signed ByMinette Brine M.D. On: 04/12/2016 10:37  Result Narrative  CLINICAL DATA:Initial treatment strategy for lung mass.  EXAM: NUCLEAR MEDICINE PET SKULL BASE TO THIGH  TECHNIQUE: 18.0 mCi F-18 FDG was injected intravenously. Full-ring PET imaging was performed from the skull base to thigh after the radiotracer. CT data was obtained and used for attenuation correction and  anatomic localization.  FASTING BLOOD GLUCOSE:Value: 91 mg/dl  COMPARISON:CT chest from 11/03/11  FINDINGS: NECK  No hypermetabolic lymph nodes in the neck.  CHEST  Moderate changes of centrilobular emphysema. Indeterminate nodular density within the left upper lobe measures 1.5 x 0.7 cm, image 80 of series 3. There is a malignant range FDG uptake on the corresponding PET images within SUV max equal to 2.38.  Moderate cardiac enlargement. There is aortic atherosclerosis and coronary artery calcifications. Previous CABG procedure. There is a pre-vascular lymph node adjacent to the descending thoracic aorta which measures 8 mm and has an SUV max equal to 2.04.  ABDOMEN/PELVIS  No abnormal hypermetabolic activity within the liver, pancreas, adrenal glands, or spleen. No hypermetabolic lymph nodes in the abdomen or pelvis.  SKELETON  No focal hypermetabolic activity to suggest skeletal metastasis.    Cardiology workup  No significant ST segment changes or arrhythmias were noted during  stress  1. Unremarkable baseline EKG. 2. Well-tolerated Lexiscan cardiac stress  administration. 3. No EKG changes suggestive of ischemia. 4. Nuclear perfusion image results will be interpreted by Radiology and  will appear in a separate report.  Tollie Pizza, DO [facility] Interventional Cardiology and Peripheral Vascular Interventions [facility] 1/9/201811:52 AM  1. No reversible ischemia or infarction.  2. Normal left ventricular wall motion.  3. Left ventricular ejection fraction is 68%.  4. Non invasive risk stratification*: Low  *2012 Appropriate Use Criteria for Coronary Revascularization Focused Update: J Am Coll Cardiol. 2012;59(9):857-881. http://content.dementiazones.com.aspx?articleid=1201161   Electronically Signed ByGlean Hess M.D. On: 02/13/2016 15:43   This study shows minimal small vessel ischemic change and mild cortical atrophy possibly  within normal limits at this patient's age. 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 There is no abnormal enhancement.  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  FINDINGS: Calvarium/skull base: No focal marrow replacing lesion suggestive of neoplasm. Orbits: Grossly unremarkable. Paranasal sinuses: Imaged portions clear. There is however extensive increased T2 signal involving both the right and left mastoid. Brain: The cerebellum shows no abnormal signal change. The fourth ventricle is of normal size with no displacement. The brainstem structures are well outlined showing no abnormal signal. The 7th and 8th nerve complex and 5th cranial nerve are identified and unremarkable. Flow void in the vertebrobasilar and anterior circulation is identified and unremarkable. The third and lateral ventricles are of normal size with no displacement. There are several areas of hyperintense T2 signal seen within the white matter of either hemisphere. There is generalized cortical atrophy.  Following injection of contrast material no abnormal enhancement is identified.  Additional comments: None.   Cardiology workup  No significant ST segment changes or arrhythmias were noted during  stress  1. Unremarkable baseline EKG. 2. Well-tolerated Lexiscan cardiac stress administration. 3. No EKG changes suggestive of ischemia. 4. Nuclear perfusion image results will be interpreted by Radiology and  will appear in a separate report.  Tollie Pizza, DO  [facility] Interventional Cardiology and Peripheral Vascular Interventions [facility] 1/9/201811:52 AM  1. No reversible ischemia or infarction.  2. Normal left ventricular wall motion.  3. Left ventricular ejection fraction is 68%.  4. Non invasive risk stratification*: Low  *2012 Appropriate Use Criteria for Coronary Revascularization Focused Update: J Am Coll Cardiol. 2012;59(9):857-881. http://content.dementiazones.com.aspx?articleid=1201161   Electronically Signed ByGlean Hess M.D. On: 02/13/2016 15:43   Recent Lab Findings: No results found for: WBC, HGB, HCT, PLT, GLUCOSE, CHOL, TRIG, HDL, LDLDIRECT, LDLCALC, ALT, AST, NA, K, CL, CREATININE, BUN, CO2, TSH, INR, GLUF, HGBA1C  Pulmonary Function Diagnosis: Mild Obstructive Airways Disease Insignificant response to bronchodilator Severe Diffusion Defect FEV1- 1.57 69% dlco 10.97 42%   Assessment / Plan:   1/ New left upper lobe lung mass, hypermetabolic- on review of the films between the original CT scan 04/01/2016 and the subsequent PET scan 04/12/2016, the lesion questions     appears to have shrunk from 3.7 cm total maximum size of 1.5 cm if the scans are accurate.  Anterior left upper lobe pulmonary nodule DECREASED IN SIZE    Continued further     resolution today would  be much more suggestive of an infectious/ inflammatory etiology than neoplasm.  3 mm posterior right lower lobe pulmonary nodule could also be reassessed at the time of followup.  Interval development of a 1.0 x 1.9 cm left lower lobe pulmonary nodule with adjacent tiny satellite nodules. The rapid interval appearance of this nodule along with a satellite nodules is more suggestive of infection than neoplasm.  2/Mild Obstructive Airways Disease 3/ Severe Diffusion Defect  4/ kNOWN cad S/P cabg  I have reviewed the CT scan with the patient and her daughter today, with a significant change in size in the left upper lobe lesion and  the sudden development of a separate lesion, I recommended to her that we not proceed with any surgical resection or even biopsy at this time, but will need a close interval follow-up CT scan in 3-4 months.   She was again strongly encouraged to stop smoking completely.   Delight Ovens MD      [address] [place] [place] [address] Office [phone]   Beeper [phone]  05/14/2016 5:16 PM

## 2016-07-19 ENCOUNTER — Other Ambulatory Visit: Payer: Self-pay

## 2016-07-19 ENCOUNTER — Telehealth: Payer: Self-pay | Admitting: Cardiothoracic Surgery

## 2016-07-19 DIAGNOSIS — R911 Solitary pulmonary nodule: Secondary | ICD-10-CM

## 2016-07-19 NOTE — Telephone Encounter (Signed)
      [address]       [place] [id number]             [phone]    [facility] . This shows the left  Lower lobe lung lesion that was suspected to be inflammatory on last super d ct of chest. Has enlarged. After  [facility] of [facility] I have arranged for ct guided needle bx of left lower lung lesion, for path and culture. Patient on Plavix and asa.  Ct tobe  loaded into PACS system  Delight OvensEdward B Aubery Date MD      [address] [place] [id number] Office [phone]   Beeper [phone]

## 2016-07-31 ENCOUNTER — Other Ambulatory Visit: Payer: Self-pay | Admitting: Radiology

## 2016-08-01 ENCOUNTER — Encounter (HOSPITAL_COMMUNITY): Payer: Self-pay | Admitting: *Deleted

## 2016-08-01 ENCOUNTER — Observation Stay (HOSPITAL_COMMUNITY): Payer: Medicare Other

## 2016-08-01 ENCOUNTER — Inpatient Hospital Stay (HOSPITAL_COMMUNITY)
Admission: RE | Admit: 2016-08-01 | Discharge: 2016-08-03 | DRG: 201 | Disposition: A | Discharge status: Home / Self Care | Payer: Medicare Other | Source: Ambulatory Visit | Attending: Internal Medicine | Admitting: Internal Medicine

## 2016-08-01 ENCOUNTER — Ambulatory Visit (HOSPITAL_COMMUNITY)
Admission: RE | Admit: 2016-08-01 | Discharge: 2016-08-01 | Disposition: A | Discharge status: Home / Self Care | Payer: Medicare Other | Source: Ambulatory Visit | Attending: Interventional Radiology | Admitting: Interventional Radiology

## 2016-08-01 ENCOUNTER — Other Ambulatory Visit: Payer: Self-pay | Admitting: Cardiothoracic Surgery

## 2016-08-01 DIAGNOSIS — R911 Solitary pulmonary nodule: Secondary | ICD-10-CM

## 2016-08-01 DIAGNOSIS — J449 Chronic obstructive pulmonary disease, unspecified: Secondary | ICD-10-CM

## 2016-08-01 DIAGNOSIS — Z720 Tobacco use: Secondary | ICD-10-CM

## 2016-08-01 DIAGNOSIS — J95811 Postprocedural pneumothorax: Secondary | ICD-10-CM | POA: Diagnosis not present

## 2016-08-01 DIAGNOSIS — I272 Pulmonary hypertension, unspecified: Secondary | ICD-10-CM

## 2016-08-01 DIAGNOSIS — R918 Other nonspecific abnormal finding of lung field: Secondary | ICD-10-CM

## 2016-08-01 DIAGNOSIS — F17210 Nicotine dependence, cigarettes, uncomplicated: Secondary | ICD-10-CM | POA: Diagnosis present

## 2016-08-01 DIAGNOSIS — Z79890 Hormone replacement therapy (postmenopausal): Secondary | ICD-10-CM

## 2016-08-01 DIAGNOSIS — K219 Gastro-esophageal reflux disease without esophagitis: Secondary | ICD-10-CM | POA: Diagnosis not present

## 2016-08-01 DIAGNOSIS — I10 Essential (primary) hypertension: Secondary | ICD-10-CM

## 2016-08-01 DIAGNOSIS — Z7982 Long term (current) use of aspirin: Secondary | ICD-10-CM

## 2016-08-01 DIAGNOSIS — E785 Hyperlipidemia, unspecified: Secondary | ICD-10-CM | POA: Diagnosis present

## 2016-08-01 DIAGNOSIS — R0902 Hypoxemia: Secondary | ICD-10-CM

## 2016-08-01 DIAGNOSIS — Z79899 Other long term (current) drug therapy: Secondary | ICD-10-CM

## 2016-08-01 DIAGNOSIS — Z9889 Other specified postprocedural states: Secondary | ICD-10-CM

## 2016-08-01 DIAGNOSIS — I2510 Atherosclerotic heart disease of native coronary artery without angina pectoris: Secondary | ICD-10-CM | POA: Diagnosis present

## 2016-08-01 DIAGNOSIS — Z888 Allergy status to other drugs, medicaments and biological substances status: Secondary | ICD-10-CM

## 2016-08-01 DIAGNOSIS — E039 Hypothyroidism, unspecified: Secondary | ICD-10-CM | POA: Diagnosis present

## 2016-08-01 DIAGNOSIS — Y839 Surgical procedure, unspecified as the cause of abnormal reaction of the patient, or of later complication, without mention of misadventure at the time of the procedure: Secondary | ICD-10-CM | POA: Diagnosis present

## 2016-08-01 DIAGNOSIS — Z882 Allergy status to sulfonamides status: Secondary | ICD-10-CM

## 2016-08-01 DIAGNOSIS — Z938 Other artificial opening status: Secondary | ICD-10-CM

## 2016-08-01 DIAGNOSIS — Z880 Allergy status to penicillin: Secondary | ICD-10-CM

## 2016-08-01 DIAGNOSIS — Z881 Allergy status to other antibiotic agents status: Secondary | ICD-10-CM

## 2016-08-01 HISTORY — DX: Postprocedural pneumothorax: J95.811

## 2016-08-01 HISTORY — PX: DG BIOPSY LUNG: HXRAD146

## 2016-08-01 LAB — BASIC METABOLIC PANEL
ANION GAP: 4 — AB (ref 5–15)
BUN: 11 mg/dL (ref 6–20)
CO2: 31 mmol/L (ref 22–32)
Calcium: 8.8 mg/dL — ABNORMAL LOW (ref 8.9–10.3)
Chloride: 107 mmol/L (ref 101–111)
Creatinine, Ser: 0.79 mg/dL (ref 0.44–1.00)
GFR calc Af Amer: >60 mL/min (ref >60)
Glucose, Bld: 95 mg/dL (ref 65–99)
POTASSIUM: 4.1 mmol/L (ref 3.5–5.1)
SODIUM: 142 mmol/L (ref 135–145)

## 2016-08-01 LAB — CBC
HCT: 41.7 % (ref 36.0–46.0)
Hemoglobin: 13.4 g/dL (ref 12.0–15.0)
MCH: 29.6 pg (ref 26.0–34.0)
MCHC: 32.1 g/dL (ref 30.0–36.0)
MCV: 92.3 fL (ref 78.0–100.0)
PLATELETS: 205 10*3/uL (ref 150–400)
RBC: 4.52 MIL/uL (ref 3.87–5.11)
RDW: 13.3 % (ref 11.5–15.5)
WBC: 5.3 10*3/uL (ref 4.0–10.5)

## 2016-08-01 LAB — PROTIME-INR
INR: 0.93
PROTHROMBIN TIME: 12.4 s (ref 11.4–15.2)

## 2016-08-01 LAB — APTT: aPTT: 29 seconds (ref 24–36)

## 2016-08-01 LAB — PHOSPHORUS: Phosphorus: 4.4 mg/dL (ref 2.5–4.6)

## 2016-08-01 LAB — MAGNESIUM: MAGNESIUM: 2.2 mg/dL (ref 1.7–2.4)

## 2016-08-01 MED ORDER — LIDOCAINE-EPINEPHRINE 1 %-1:100000 IJ SOLN
INTRAMUSCULAR | Status: AC
  Administered 2016-08-01: 10 mL
  Filled 2016-08-01: qty 1

## 2016-08-01 MED ORDER — ASPIRIN EC 81 MG PO TBEC
81.0000 mg | DELAYED_RELEASE_TABLET | Freq: Every day | ORAL | Status: DC
  Administered 2016-08-01 – 2016-08-02 (×2): 81 mg via ORAL
  Filled 2016-08-01 (×2): qty 1

## 2016-08-01 MED ORDER — ALPRAZOLAM 0.5 MG PO TABS
1.0000 mg | ORAL_TABLET | Freq: Every day | ORAL | Status: DC
  Administered 2016-08-01 – 2016-08-02 (×2): 1 mg via ORAL
  Filled 2016-08-01 (×2): qty 2

## 2016-08-01 MED ORDER — CLOPIDOGREL BISULFATE 75 MG PO TABS
75.0000 mg | ORAL_TABLET | Freq: Every day | ORAL | Status: DC
  Administered 2016-08-01 – 2016-08-03 (×3): 75 mg via ORAL
  Filled 2016-08-01 (×3): qty 1

## 2016-08-01 MED ORDER — MOMETASONE FURO-FORMOTEROL FUM 200-5 MCG/ACT IN AERO
2.0000 | INHALATION_SPRAY | Freq: Two times a day (BID) | RESPIRATORY_TRACT | Status: DC
  Administered 2016-08-01 – 2016-08-03 (×4): 2 via RESPIRATORY_TRACT
  Filled 2016-08-01: qty 8.8

## 2016-08-01 MED ORDER — FERROUS SULFATE 325 (65 FE) MG PO TABS
325.0000 mg | ORAL_TABLET | Freq: Every day | ORAL | Status: DC
  Administered 2016-08-01 – 2016-08-02 (×2): 325 mg via ORAL
  Filled 2016-08-01 (×2): qty 1

## 2016-08-01 MED ORDER — VITAMIN D 1000 UNITS PO TABS
1000.0000 [IU] | ORAL_TABLET | Freq: Every day | ORAL | Status: DC
  Administered 2016-08-01 – 2016-08-02 (×2): 1000 [IU] via ORAL
  Filled 2016-08-01 (×2): qty 1

## 2016-08-01 MED ORDER — MIDAZOLAM HCL 2 MG/2ML IJ SOLN
INTRAMUSCULAR | Status: AC
  Filled 2016-08-01: qty 2

## 2016-08-01 MED ORDER — TIOTROPIUM BROMIDE MONOHYDRATE 2.5 MCG/ACT IN AERS: 2.0000 | INHALATION_SPRAY | Freq: Every day | RESPIRATORY_TRACT | Status: DC

## 2016-08-01 MED ORDER — ACETAMINOPHEN 650 MG RE SUPP: 650.0000 mg | Freq: Four times a day (QID) | RECTAL | Status: DC | PRN

## 2016-08-01 MED ORDER — ONDANSETRON HCL 4 MG/2ML IJ SOLN: 4.0000 mg | Freq: Four times a day (QID) | INTRAMUSCULAR | Status: DC | PRN

## 2016-08-01 MED ORDER — KETOROLAC TROMETHAMINE 15 MG/ML IJ SOLN
15.0000 mg | Freq: Four times a day (QID) | INTRAMUSCULAR | Status: DC
  Administered 2016-08-01 – 2016-08-03 (×8): 15 mg via INTRAVENOUS
  Filled 2016-08-01 (×8): qty 1

## 2016-08-01 MED ORDER — MORPHINE SULFATE (PF) 2 MG/ML IV SOLN: 1.0000 mg | INTRAVENOUS | Status: DC | PRN

## 2016-08-01 MED ORDER — FENTANYL CITRATE (PF) 100 MCG/2ML IJ SOLN
INTRAMUSCULAR | Status: AC
  Filled 2016-08-01: qty 2

## 2016-08-01 MED ORDER — ACETAMINOPHEN 500 MG PO TABS: 500.0000 mg | ORAL_TABLET | Freq: Four times a day (QID) | ORAL | Status: DC | PRN

## 2016-08-01 MED ORDER — LEVOTHYROXINE SODIUM 88 MCG PO TABS: 88.0000 ug | ORAL_TABLET | Freq: Every day | ORAL | Status: DC

## 2016-08-01 MED ORDER — FLUOXETINE HCL 20 MG PO CAPS
80.0000 mg | ORAL_CAPSULE | Freq: Every day | ORAL | Status: DC
  Administered 2016-08-01 – 2016-08-03 (×4): 80 mg via ORAL
  Filled 2016-08-01 (×3): qty 4

## 2016-08-01 MED ORDER — ACETAMINOPHEN 325 MG PO TABS
650.0000 mg | ORAL_TABLET | Freq: Four times a day (QID) | ORAL | Status: DC | PRN
  Administered 2016-08-01 (×2): 650 mg via ORAL
  Filled 2016-08-01 (×2): qty 2

## 2016-08-01 MED ORDER — LISINOPRIL 10 MG PO TABS
10.0000 mg | ORAL_TABLET | Freq: Every day | ORAL | Status: DC
  Administered 2016-08-01 – 2016-08-02 (×2): 10 mg via ORAL
  Filled 2016-08-01 (×2): qty 1

## 2016-08-01 MED ORDER — SODIUM CHLORIDE 0.9 % IV SOLN: INTRAVENOUS | Status: DC

## 2016-08-01 MED ORDER — ONDANSETRON HCL 4 MG PO TABS: 4.0000 mg | ORAL_TABLET | Freq: Four times a day (QID) | ORAL | Status: DC | PRN

## 2016-08-01 MED ORDER — ATORVASTATIN CALCIUM 80 MG PO TABS
80.0000 mg | ORAL_TABLET | Freq: Every day | ORAL | Status: DC
  Administered 2016-08-01 – 2016-08-02 (×2): 80 mg via ORAL
  Filled 2016-08-01 (×2): qty 1

## 2016-08-01 MED ORDER — ALBUTEROL SULFATE (2.5 MG/3ML) 0.083% IN NEBU: 2.5000 mg | INHALATION_SOLUTION | RESPIRATORY_TRACT | Status: DC | PRN

## 2016-08-01 MED ORDER — METOPROLOL SUCCINATE ER 50 MG PO TB24
50.0000 mg | ORAL_TABLET | Freq: Every day | ORAL | Status: DC
  Administered 2016-08-01 – 2016-08-03 (×3): 50 mg via ORAL
  Filled 2016-08-01 (×3): qty 1

## 2016-08-01 MED ORDER — SENNOSIDES-DOCUSATE SODIUM 8.6-50 MG PO TABS
1.0000 | ORAL_TABLET | Freq: Every day | ORAL | Status: DC
  Administered 2016-08-01 – 2016-08-02 (×2): 1 via ORAL
  Filled 2016-08-01 (×4): qty 1

## 2016-08-01 MED ORDER — MIDAZOLAM HCL 2 MG/2ML IJ SOLN
INTRAMUSCULAR | Status: AC | PRN
  Administered 2016-08-01: 1 mg via INTRAVENOUS

## 2016-08-01 MED ORDER — OXYCODONE HCL 5 MG PO TABS
5.0000 mg | ORAL_TABLET | ORAL | Status: DC | PRN
Start: 2016-08-01 — End: 2016-08-03
  Administered 2016-08-01 – 2016-08-02 (×4): 5 mg via ORAL
  Filled 2016-08-01 (×4): qty 1

## 2016-08-01 MED ORDER — FENTANYL CITRATE (PF) 100 MCG/2ML IJ SOLN
INTRAMUSCULAR | Status: AC | PRN
  Administered 2016-08-01 (×2): 25 ug via INTRAVENOUS

## 2016-08-01 MED ORDER — FENTANYL CITRATE (PF) 100 MCG/2ML IJ SOLN
INTRAMUSCULAR | Status: DC | PRN
  Administered 2016-08-01: 25 ug via INTRAVENOUS

## 2016-08-01 MED ORDER — PANTOPRAZOLE SODIUM 40 MG PO TBEC
40.0000 mg | DELAYED_RELEASE_TABLET | Freq: Every day | ORAL | Status: DC
  Administered 2016-08-01 – 2016-08-03 (×3): 40 mg via ORAL
  Filled 2016-08-01 (×3): qty 1

## 2016-08-01 MED ORDER — MORPHINE SULFATE (PF) 4 MG/ML IV SOLN: 1.0000 mg | INTRAVENOUS | Status: DC | PRN

## 2016-08-01 MED ORDER — SODIUM CHLORIDE 0.9% FLUSH
3.0000 mL | Freq: Two times a day (BID) | INTRAVENOUS | Status: DC
  Administered 2016-08-01 – 2016-08-03 (×5): 3 mL via INTRAVENOUS

## 2016-08-01 MED ORDER — LEVOTHYROXINE SODIUM 88 MCG PO TABS
88.0000 ug | ORAL_TABLET | Freq: Every day | ORAL | Status: DC
  Administered 2016-08-02 – 2016-08-03 (×2): 88 ug via ORAL
  Filled 2016-08-01 (×2): qty 1

## 2016-08-01 MED ORDER — NICOTINE 21 MG/24HR TD PT24
21.0000 mg | MEDICATED_PATCH | Freq: Every day | TRANSDERMAL | Status: DC
Start: 2016-08-01 — End: 2016-08-03
  Administered 2016-08-01 – 2016-08-02 (×2): 21 mg via TRANSDERMAL
  Filled 2016-08-01 (×3): qty 1

## 2016-08-01 NOTE — H&P (Signed)
History and Physical    Casey Moran HQI:[id number] DOB: [birth year] DOA: 08/01/2016   PCP: Elijio Miles., MD   Patient coming from/Resides with: Daughter and son-in-law  Chief Complaint: Postprocedure complication requiring admission  HPI: Casey Moran is a 76 y.o. female with medical history significant for CAD and prior CABG, hypertension, dyslipidemia, hypothyroidism, COPD on nocturnal oxygen and tobacco abuse. Patient underwent lung cancer screening/CT of the chest as ordered by her pulmonologist and was found to have a lung nodule on the left that was irregularly shaped and measured 3.7 x 2.1 x 1.8 cm. A follow-up PET scan demonstrated persistence of this area but had decreased in size. She was evaluated by Dr. Tyrone Sage with CVTS in April after undergoing a follow-up scan that demonstrated significant decrease in size of that lesion (? Inflammatory etiology) with interval development of a new left lower lobe pulmonary nodule with adjacent tiny satellite nodules which was more concerning for neoplasm therefore patient was referred to interventional radiology for biopsy procedure. Of note in the past 3 months patient has resumed smoking 2-3 cigarettes per day after having quit in 2000. Patient underwent lung biopsy today in interventional radiology but developed a post procedure pneumothorax requiring chest tube placement. Patient was referred to the hospitalist service for admission with intervention radiology will be managing the chest tube.   Review of Systems:  In addition to the HPI above,  No Fever-chills, myalgias or other constitutional symptoms No Headache, changes with Vision or hearing, new weakness, tingling, numbness in any extremity, dizziness, dysarthria or word finding difficulty, gait disturbance or imbalance, tremors or seizure activity No problems swallowing food or Liquids, indigestion/reflux, choking or coughing while eating, abdominal pain with or after eating No  Chest pain, Cough or Shortness of Breath, palpitations, orthopnea or DOE No Abdominal pain, N/V, melena,hematochezia, dark tarry stools, constipation No dysuria, malodorous urine, hematuria or flank pain No new skin rashes, lesions, masses or bruises, No new joint pains, aches, swelling or redness No recent unintentional weight gain or loss No polyuria, polydypsia or polyphagia   Past Medical History:  Diagnosis Date  . Abscess of finger of left hand   . Anxiety   . ASCVD (arteriosclerotic cardiovascular disease)   . CAD (coronary artery disease)   . COPD (chronic obstructive pulmonary disease) (HCC)   . Depression   . Family history of hereditary hemorrhagic telangiectasia (HHT)   . GERD (gastroesophageal reflux disease)   . Hematuria, microscopic   . Hyperlipemia   . Hypertension   . Hypokalemia   . Hypothyroidism   . Lung mass   . Pulmonary hypertension (HCC)     Past Surgical History:  Procedure Laterality Date  . ABDOMINAL HYSTERECTOMY    . CORONARY ARTERY BYPASS GRAFT     [facility]/Dr Arlyss Queen     Social History   Social History  . Marital status: Widowed    Spouse name: N/A  . Number of children: N/A  . Years of education: N/A   Occupational History  . Not on file.   Social History Main Topics  . Smoking status: Current Every Day Smoker    Types: Cigarettes  . Smokeless tobacco: Never Used  . Alcohol use No  . Drug use: No  . Sexual activity: No   Other Topics Concern  . Not on file   Social History Narrative  . No narrative on file    Mobility: Independent Work history: Not obtained   Allergies  Allergen Reactions  .  Bupropion Tinitus  . Opium Palpitations and Other (See Comments)    Increased heart rate  . Azithromycin Rash  . Ciprofloxacin Other (See Comments)    Bruising  . Penicillins   . Sulfa Antibiotics   . Amoxicillin Rash  . Clindamycin/Lincomycin Rash  . Codeine Phosphate [Codeine] Rash  . Penicillin G Rash  .  Sulfamethoxazole Rash    Family history reviewed and not pertinent to current admission findings her diagnosis   Prior to Admission medications   Medication Sig Start Date End Date Taking? Authorizing Provider  acetaminophen (TYLENOL) 500 MG tablet Take 500-1,000 mg by mouth every 6 (six) hours as needed for mild pain or moderate pain.    Yes [provider]  albuterol (PROVENTIL HFA;VENTOLIN HFA) 108 (90 Base) MCG/ACT inhaler Inhale 2 puffs into the lungs every 4 (four) hours as needed for wheezing or shortness of breath.    Yes [provider]  ALPRAZolam Prudy Feeler) 1 MG tablet Take 1 mg by mouth at bedtime.  02/16/16  Yes [provider]  aspirin EC 81 MG tablet Take 81 mg by mouth at bedtime.    Yes [provider]  atorvastatin (LIPITOR) 80 MG tablet Take 80 mg by mouth at bedtime.    Yes [provider]  budesonide-formoterol (SYMBICORT) 160-4.5 MCG/ACT inhaler Inhale 2 puffs into the lungs 2 (two) times daily.    Yes [provider]  Calcium-Phosphorus-Vitamin D (CALCIUM/D3 ADULT GUMMIES PO) Take 2 each by mouth daily.   Yes [provider]  Cholecalciferol (VITAMIN D3) 1000 units CAPS Take 1 capsule by mouth at bedtime.    Yes [provider]  clopidogrel (PLAVIX) 75 MG tablet TAKE 1 TABLET BY MOUTH DAILY 01/24/16  Yes [provider]  ferrous sulfate 325 (65 FE) MG EC tablet Take 325 mg by mouth at bedtime.   Yes [provider]  FIBER ADULT GUMMIES PO Take 2 each by mouth at bedtime.   Yes [provider]  FLUoxetine (PROZAC) 40 MG capsule Take 80 mg by mouth every morning.    Yes [provider]  levothyroxine (SYNTHROID, LEVOTHROID) 88 MCG tablet TAKE 1 TABLET EVERY DAY  AT  6AM 02/27/16  Yes [provider]  levothyroxine (SYNTHROID, LEVOTHROID) 88 MCG tablet Take 88 mcg by mouth daily before breakfast.   Yes [provider]  lisinopril (PRINIVIL,ZESTRIL) 10 MG  tablet Take 10 mg by mouth every morning.   Yes [provider]  metoprolol succinate (TOPROL-XL) 50 MG 24 hr tablet Take 50 mg by mouth every morning.  03/31/16  Yes [provider]  nitroGLYCERIN (NITROSTAT) 0.4 MG SL tablet PLACE ONE TABLET UNDER THE TONGUE EVERY FIVE MINUTES AS NEEDED FOR CHEST PAIN AS DIRECTED 04/04/15  Yes [provider]  omeprazole (PRILOSEC) 40 MG capsule Take 40 mg by mouth every morning.  04/15/16  Yes [provider]  sennosides-docusate sodium (SENOKOT-S) 8.6-50 MG tablet Take 1 tablet by mouth at bedtime.   Yes [provider]  Tiotropium Bromide Monohydrate (SPIRIVA RESPIMAT) 2.5 MCG/ACT AERS Inhale 2 puffs into the lungs daily.  02/20/16  Yes [provider]    Physical Exam: Vitals:   08/01/16 0900 08/01/16 0906 08/01/16 0910 08/01/16 0915  BP: (!) 130/107 (!) 131/51 (!) 127/50 120/74  Pulse: 73 73 72 70  Resp: (!) 22 20 17 19   Temp:      TempSrc:      SpO2: 97% 97% 96% 96%  Weight:  Height:          Constitutional: NAD, calm, uncomfortable 2/2 pleuritic chest discomfort with inspiration; patient appears pale Eyes: PERRL, lids and conjunctivae normal ENMT: Mucous membranes are moist. Posterior pharynx clear of any exudate or lesions.Normal dentition.  Neck: normal, supple, no masses, no thyromegaly Respiratory: clear to auscultation bilaterally, no wheezing, no crackles. Normal respiratory effort. No accessory muscle use. Slightly diminished in the bases with patient splinting secondary to pleuritic chest discomfort. Left side small bore chest tube in place to Pleur-evac collection system Cardiovascular: Regular rate and rhythm, no murmurs / rubs / gallops. No extremity edema. 2+ pedal pulses. No carotid bruits.  Abdomen: no tenderness, no masses palpated. No hepatosplenomegaly. Bowel sounds positive.  Musculoskeletal: no clubbing / cyanosis. No joint deformity upper and lower extremities. Good ROM,  no contractures. Normal muscle tone.  Skin: no rashes, lesions, ulcers. No induration Neurologic: CN 2-12 grossly intact. Sensation intact, DTR normal. Strength 5/5 x all 4 extremities.  Psychiatric: Normal judgment and insight. Alert and oriented x 3. Normal mood.    Labs on Admission: I have personally reviewed following labs and imaging studies  CBC:  Recent Labs Lab 08/01/16 0600  WBC 5.3  HGB 13.4  HCT 41.7  MCV 92.3  PLT 205   Basic Metabolic Panel: No results for input(s): NA, K, CL, CO2, GLUCOSE, BUN, CREATININE, CALCIUM, MG, PHOS in the last 168 hours. GFR: CrCl cannot be calculated (No order found.). Liver Function Tests: No results for input(s): AST, ALT, ALKPHOS, BILITOT, PROT, ALBUMIN in the last 168 hours. No results for input(s): LIPASE, AMYLASE in the last 168 hours. No results for input(s): AMMONIA in the last 168 hours. Coagulation Profile:  Recent Labs Lab 08/01/16 0600  INR 0.93   Cardiac Enzymes: No results for input(s): CKTOTAL, CKMB, CKMBINDEX, TROPONINI in the last 168 hours. BNP (last 3 results) No results for input(s): PROBNP in the last 8760 hours. HbA1C: No results for input(s): HGBA1C in the last 72 hours. CBG: No results for input(s): GLUCAP in the last 168 hours. Lipid Profile: No results for input(s): CHOL, HDL, LDLCALC, TRIG, CHOLHDL, LDLDIRECT in the last 72 hours. Thyroid Function Tests: No results for input(s): TSH, T4TOTAL, FREET4, T3FREE, THYROIDAB in the last 72 hours. Anemia Panel: No results for input(s): VITAMINB12, FOLATE, FERRITIN, TIBC, IRON, RETICCTPCT in the last 72 hours. Urine analysis: No results found for: COLORURINE, APPEARANCEUR, LABSPEC, PHURINE, GLUCOSEU, HGBUR, BILIRUBINUR, KETONESUR, PROTEINUR, UROBILINOGEN, NITRITE, LEUKOCYTESUR Sepsis Labs: @LABRCNTIP (procalcitonin:4,lacticidven:4) )No results found for this or any previous visit (from the past 240 hour(s)).   Radiological Exams on Admission: No results  found.    Assessment/Plan Principal Problem:   Pneumothorax of left lung after biopsy for Lung mass -Patient underwent IR directed lung biopsy with development of post procedure pneumothorax requiring placement of chest tube -Management of chest tube per IR -Patient endorsing pleuritic chest discomfort with inspiratory effort and visibly splinting therefore will begin pain management as follows:   Scheduled IV Toradol with PPI   Low-dose OxyIR for moderate pain   IV morphine for severe pain -Incentive spirometry -Admit to stepdown  Active Problems:   Hypertension -Blood pressure currently controlled -Continue preadmission lisinopril and metoprolol    Pulmonary hypertension/COPD with hypoxia  -Continue nocturnal O2 at 2 L/m -Continue Symbicort, albuterol and Spiriva    CAD (coronary artery disease)/history of CABG in 2000 -Currently asymptomatic -Continue baby aspirin, Plavix, statin, beta blocker     Tobacco abuse -Cessation counseling discussed -Nicotine patch -Continue  Xanax prn    GERD (gastroesophageal reflux disease) -Continue Protonix    Hypothyroidism -Continue Synthroid      DVT prophylaxis: SCDs Code Status: Full Family Communication: Son-in-law at bedside Disposition Plan: Home Consults called: Interventional radiology/[facility]    ELLIS,ALLISON L. ANP-BC [facility] Pager [phone]   If 7PM-7AM, please contact night-coverage [email or web address] Password [email or web address]  08/01/2016, 10:20 AM

## 2016-08-01 NOTE — Discharge Instructions (Addendum)
Moderate Conscious Sedation, Adult, Care After °These instructions provide you with information about caring for yourself after your procedure. Your health care provider may also give you more specific instructions. Your treatment has been planned according to current medical practices, but problems sometimes occur. Call your health care provider if you have any problems or questions after your procedure. °What can I expect after the procedure? °After your procedure, it is common: °· To feel sleepy for several hours. °· To feel clumsy and have poor balance for several hours. °· To have poor judgment for several hours. °· To vomit if you eat too soon. ° °Follow these instructions at home: °For at least 24 hours after the procedure: ° °· Do not: °? Participate in activities where you could fall or become injured. °? Drive. °? Use heavy machinery. °? Drink alcohol. °? Take sleeping pills or medicines that cause drowsiness. °? Make important decisions or sign legal documents. °? Take care of children on your own. °· Rest. °Eating and drinking °· Follow the diet recommended by your health care provider. °· If you vomit: °? Drink water, juice, or soup when you can drink without vomiting. °? Make sure you have little or no nausea before eating solid foods. °General instructions °· Have a responsible adult stay with you until you are awake and alert. °· Take over-the-counter and prescription medicines only as told by your health care provider. °· If you smoke, do not smoke without supervision. °· Keep all follow-up visits as told by your health care provider. This is important. °Contact a health care provider if: °· You keep feeling nauseous or you keep vomiting. °· You feel light-headed. °· You develop a rash. °· You have a fever. °Get help right away if: °· You have trouble breathing. °This information is not intended to replace advice given to you by your health care provider. Make sure you discuss any questions you have  with your health care provider. °Document Released: 11/11/2012 Document Revised: 06/26/2015 Document Reviewed: 05/13/2015 °Elsevier Interactive Patient Education © 2018 [employer] °Needle Biopsy of the Lung, Care After °This sheet gives you information about how to care for yourself after your procedure. Your health care provider may also give you more specific instructions. If you have problems or questions, contact your health care provider. °What can I expect after the procedure? °After the procedure, it is common to have: °· Soreness, pain, and tenderness where a tissue sample was taken (biopsy site). °· A cough. °· A sore throat. ° °Follow these instructions at home: °Biopsy site care °· Follow instructions from your health care provider about when to remove the bandage that was placed on the biopsy site. °· Keep the bandage dry until it has been removed. °· Check your biopsy site every day for signs of infection. Check for: °? More redness, swelling, or pain. °? More fluid or blood. °? Warmth to the touch. °? Pus or a bad smell. °General instructions °· Rest as directed by your health care provider. Ask your health care provider what activities are safe for you. °· Do not take baths, swim, or use a hot tub until your health care provider approves. °· Take over-the-counter and prescription medicines only as told by your health care provider. °· If you have airplane travel scheduled, talk with your health care provider about when it is safe for you to travel by airplane. °· It is up to you to get the results of your procedure. Ask your health care   provider, or the department that is doing the procedure, when your results will be ready. °· Keep all follow-up visits as told by your health care provider. This is important. °Contact a health care provider if: °· You have more redness, swelling, or pain around your biopsy site. °· You have more fluid or blood coming from your biopsy site. °· Your biopsy site feels  warm to the touch. °· You have pus or a bad smell coming from your biopsy site. °· You have a fever. °· You have pain that does not get better with medicine. °Get help right away if: °· You have problems breathing. °· You have chest pain. °· You cough up blood. °· You faint. °· You have a fast heart rate. °Summary °· After a needle biopsy of the lung, it is common to have a cough, a sore throat, or soreness, pain, and tenderness where a tissue sample was taken (biopsy site). °· You should check your biopsy area every day for signs of infection, including pus or a bad smell, warmth, more fluid or blood, or more redness, swelling, or pain. °· You should not take baths, swim, or use a hot tub until your health care provider approves. °· It is up to you to get the results of your procedure. Ask your health care provider, or the department that is doing the procedure, when your results will be ready. °This information is not intended to replace advice given to you by your health care provider. Make sure you discuss any questions you have with your health care provider. °Document Released: 11/18/2006 Document Revised: 12/13/2015 Document Reviewed: 12/13/2015 °Elsevier Interactive Patient Education © 2017 [employer] °Moderate Conscious Sedation, Adult, Care After °These instructions provide you with information about caring for yourself after your procedure. Your health care provider may also give you more specific instructions. Your treatment has been planned according to current medical practices, but problems sometimes occur. Call your health care provider if you have any problems or questions after your procedure. °What can I expect after the procedure? °After your procedure, it is common: °· To feel sleepy for several hours. °· To feel clumsy and have poor balance for several hours. °· To have poor judgment for several hours. °· To vomit if you eat too soon. ° °Follow these instructions at home: °For at least 24  hours after the procedure: ° °· Do not: °? Participate in activities where you could fall or become injured. °? Drive. °? Use heavy machinery. °? Drink alcohol. °? Take sleeping pills or medicines that cause drowsiness. °? Make important decisions or sign legal documents. °? Take care of children on your own. °· Rest. °Eating and drinking °· Follow the diet recommended by your health care provider. °· If you vomit: °? Drink water, juice, or soup when you can drink without vomiting. °? Make sure you have little or no nausea before eating solid foods. °General instructions °· Have a responsible adult stay with you until you are awake and alert. °· Take over-the-counter and prescription medicines only as told by your health care provider. °· If you smoke, do not smoke without supervision. °· Keep all follow-up visits as told by your health care provider. This is important. °Contact a health care provider if: °· You keep feeling nauseous or you keep vomiting. °· You feel light-headed. °· You develop a rash. °· You have a fever. °Get help right away if: °· You have trouble breathing. °This information is not intended to   replace advice given to you by your health care provider. Make sure you discuss any questions you have with your health care provider. °Document Released: 11/11/2012 Document Revised: 06/26/2015 Document Reviewed: 05/13/2015 °Elsevier Interactive Patient Education © 2018 [employer] ° °

## 2016-08-01 NOTE — Procedures (Signed)
Pre procedural Dx: Lung Nodule Post procedural Dx: Same  Technically successful CT guided biopsy of indeterminate left lower lobe pulmonary nodule   EBL: None.   Complications: Procedure complicated by development of left sided chest tube requiring CT guided chest tube placement.   Katherina RightJay Jensyn Shave, MD Pager #: [phone]

## 2016-08-01 NOTE — Sedation Documentation (Signed)
Small pneumothorax chest tube placed.

## 2016-08-01 NOTE — H&P (Signed)
Chief Complaint: Patient was seen in consultation today for lung nodule  Referring Physician(s): Delight Ovens  Supervising Physician: Simonne Come  Patient Status: [facility] - Out-pt  History of Present Illness: Casey Moran is a 76 y.o. female with past medical history of anxiety, CAD, COPD, GERD, HLD, HTN, hypothyroidism who has been followed by Dr. Tyrone Sage for a lung nodule.   Surveillance imaging of the left upper lobe lung lesion showed a significant decreased in size between the original CT scan and the PET scan. However, CT Chest 07/17/16 showed:left lower lobe lung mass that was suspected to be inflammatory on last CT has enlarged.   IR consulted for lung biopsy at the request of Dr. Tyrone Sage.   Patient presents today in her usual state of health.  She has been NPO.  She stopped her Plavix on 6/23.   Past Medical History:  Diagnosis Date  . Abscess of finger of left hand   . Anxiety   . ASCVD (arteriosclerotic cardiovascular disease)   . CAD (coronary artery disease)   . COPD (chronic obstructive pulmonary disease) (HCC)   . Depression   . Family history of hereditary hemorrhagic telangiectasia (HHT)   . GERD (gastroesophageal reflux disease)   . Hematuria, microscopic   . Hyperlipemia   . Hypertension   . Hypokalemia   . Hypothyroidism   . Lung mass   . Pulmonary hypertension (HCC)     Past Surgical History:  Procedure Laterality Date  . ABDOMINAL HYSTERECTOMY    . CORONARY ARTERY BYPASS GRAFT      cardiology/Dr Arlyss Queen     Allergies: Bupropion; Opium; Azithromycin; Ciprofloxacin; Penicillins; Sulfa antibiotics; Amoxicillin; Clindamycin/lincomycin; Codeine phosphate [codeine]; Penicillin g; and Sulfamethoxazole  Medications: Prior to Admission medications   Medication Sig Start Date End Date Taking? Authorizing Provider  acetaminophen (TYLENOL) 500 MG tablet Take 500-1,000 mg by mouth every 6 (six) hours as needed for mild pain or  moderate pain.    Yes [provider]  albuterol (PROVENTIL HFA;VENTOLIN HFA) 108 (90 Base) MCG/ACT inhaler Inhale 2 puffs into the lungs every 4 (four) hours as needed for wheezing or shortness of breath.    Yes [provider]  ALPRAZolam Prudy Feeler) 1 MG tablet Take 1 mg by mouth at bedtime.  02/16/16  Yes [provider]  aspirin EC 81 MG tablet Take 81 mg by mouth at bedtime.    Yes [provider]  atorvastatin (LIPITOR) 80 MG tablet Take 80 mg by mouth at bedtime.    Yes [provider]  budesonide-formoterol (SYMBICORT) 160-4.5 MCG/ACT inhaler Inhale 2 puffs into the lungs 2 (two) times daily.    Yes [provider]  Calcium-Phosphorus-Vitamin D (CALCIUM/D3 ADULT GUMMIES PO) Take 2 each by mouth daily.   Yes [provider]  Cholecalciferol (VITAMIN D3) 1000 units CAPS Take 1 capsule by mouth at bedtime.    Yes [provider]  clopidogrel (PLAVIX) 75 MG tablet TAKE 1 TABLET BY MOUTH DAILY 01/24/16  Yes [provider]  ferrous sulfate 325 (65 FE) MG EC tablet Take 325 mg by mouth at bedtime.   Yes [provider]  FIBER ADULT GUMMIES PO Take 2 each by mouth at bedtime.   Yes [provider]  FLUoxetine (PROZAC) 40 MG capsule Take 80 mg by mouth every morning.    Yes [provider]  levothyroxine (SYNTHROID, LEVOTHROID) 88 MCG tablet TAKE 1 TABLET EVERY DAY  AT  6AM 02/27/16  Yes [provider]  levothyroxine (SYNTHROID, LEVOTHROID) 88 MCG tablet Take 88 mcg by mouth daily before breakfast.   Yes [provider]  lisinopril (PRINIVIL,ZESTRIL) 10 MG tablet Take 10 mg by mouth every morning.   Yes [provider]  metoprolol succinate (TOPROL-XL) 50 MG 24 hr tablet Take 50 mg by mouth every morning.  03/31/16  Yes [provider]  nitroGLYCERIN (NITROSTAT) 0.4 MG SL tablet PLACE ONE TABLET UNDER THE TONGUE EVERY FIVE MINUTES AS NEEDED FOR CHEST PAIN AS  DIRECTED 04/04/15  Yes [provider]  omeprazole (PRILOSEC) 40 MG capsule Take 40 mg by mouth every morning.  04/15/16  Yes [provider]  sennosides-docusate sodium (SENOKOT-S) 8.6-50 MG tablet Take 1 tablet by mouth at bedtime.   Yes [provider]  Tiotropium Bromide Monohydrate (SPIRIVA RESPIMAT) 2.5 MCG/ACT AERS Inhale 2 puffs into the lungs daily.  02/20/16  Yes [provider]     No family history on file.  Social History   Social History  . Marital status: Widowed    Spouse name: N/A  . Number of children: N/A  . Years of education: N/A   Social History Main Topics  . Smoking status: Current Every Day Smoker    Types: Cigarettes  . Smokeless tobacco: Never Used  . Alcohol use No  . Drug use: No  . Sexual activity: No   Other Topics Concern  . Not on file   Social History Narrative  . No narrative on file     Review of Systems  Constitutional: Negative for fatigue and fever.  Respiratory: Negative for cough and shortness of breath.   Cardiovascular: Negative for chest pain.  Psychiatric/Behavioral: Negative for behavioral problems and confusion.    Vital Signs: BP 118/64   Pulse 62   Temp 98.3 F (36.8 C) (Oral)   Resp 16   Ht 5' 5.5" (1.664 m)   Wt 125 lb (56.7 kg)   SpO2 99%   BMI 20.48 kg/m   Physical Exam  Constitutional: She is oriented to person, place, and time. She appears well-developed.  Cardiovascular: Normal rate, regular rhythm and normal heart sounds.   Pulmonary/Chest: Effort normal and breath sounds normal. No respiratory distress.  Neurological: She is alert and oriented to person, place, and time.  Skin: Skin is warm and dry.  Psychiatric: She has a normal mood and affect. Her behavior is normal. Judgment and thought content normal.  Nursing note and vitals reviewed.   Mallampati Score:  MD Evaluation Airway: WNL Heart: WNL Abdomen: WNL Chest/ Lungs: WNL ASA  Classification:  3 Mallampati/Airway Score: Two  Imaging: No results found.  Labs:  CBC:  Recent Labs  08/01/16 0600  WBC 5.3  HGB 13.4  HCT 41.7  PLT 205    COAGS:  Recent Labs  08/01/16 0600  INR 0.93  APTT 29    BMP: No results for input(s): NA, K, CL, CO2, GLUCOSE, BUN, CALCIUM, CREATININE, GFRNONAA, GFRAA in the last 8760 hours.  Invalid input(s): CMP  LIVER FUNCTION TESTS: No results for input(s): BILITOT, AST, ALT, ALKPHOS, PROT, ALBUMIN in the last 8760 hours.  TUMOR MARKERS: No results for input(s): AFPTM, CEA, CA199, CHROMGRNA in the last 8760 hours.  Assessment and Plan: Patient with history of lung nodule suspected to be inflammatory but with recent changes in size.  IR consulted for left lower lung nodule biopsy at the request of Dr. Tyrone SageGerhardt.  Patient has been NPO.  She does not take blood thinners.  She has  been in her usual state of health.  She appropriately stopped her Plavix on 07/27/16. Risks and Benefits discussed with the patient including, but not limited to bleeding, infection, damage to adjacent structures or low yield requiring additional tests. All of the patient's questions were answered, patient is agreeable to proceed. Consent signed and in chart.  Thank you for this interesting consult.  I greatly enjoyed meeting Casey Moran and look forward to participating in their care.  A copy of this report was sent to the requesting provider on this date.  Electronically Signed: Hoyt Koch, PA 08/01/2016, 8:00 AM   I spent a total of  30 Minutes   in face to face in clinical consultation, greater than 50% of which was counseling/coordinating care for lung nodule.

## 2016-08-01 NOTE — Progress Notes (Signed)
[facility].  [facility] to admit patient, IR to manage chest tube.   Loyce DysKacie Javonta Gronau, MMS RDN PA-C 9:35 AM

## 2016-08-01 NOTE — Consult Note (Signed)
[address]       [place] [address]             [phone]        [name] [facility] Medical Record #[id number] Date of Birth: 1940/03/15  Referring: Delight Ovens, MD Primary Care: Elijio Miles., MD  Chief Complaint:   Iatrogenic pneumothorax with chest tube placement  History of Present Illness:     [name] is a 76 year old female patient with a past medical history of anxiety, tobacco abuse, hypertension, hypothyroidism, hyperlipidemia, gastric reflux disease, COPD, and coronary artery disease who has been followed by our service for a lung nodule. Recent chest CT on 07/17/2016 showed a left lower lobe lung mass that was suspected to be inflammatory and was enlarged. Interventional radiology was consulted for a lung biopsy. During the biopsy the patient developed a pneumothorax. A chest tube was placed by interventional radiology. Recent chest xray reviewed with the patient which showed resolution of pneumothorax. We are consulted for assistance with chest tube management. She is currently on 2L [place] for oxygen support and has some trouble with deep breaths, but is overall comfortable.    Current Activity/ Functional Status: Patient was independent with mobility/ambulation, transfers, ADL's, IADL's.   Zubrod Score: At the time of surgery this patient's most appropriate activity status/level should be described as: []     0    Normal activity, no symptoms [x]     1    Restricted in physical strenuous activity but ambulatory, able to do out light work []     2    Ambulatory and capable of self care, unable to do work activities, up and about                 more than 50%  Of the time                            []     3    Only limited self care, in bed greater than 50% of waking hours []     4    Completely disabled, no self care, confined to bed or chair []     5    Moribund  Past Medical History:  Diagnosis Date  . Abscess of finger of  left hand   . Anxiety   . ASCVD (arteriosclerotic cardiovascular disease)   . CAD (coronary artery disease)   . COPD (chronic obstructive pulmonary disease) (HCC)   . Depression   . Family history of hereditary hemorrhagic telangiectasia (HHT)   . GERD (gastroesophageal reflux disease)   . Hematuria, microscopic   . Hyperlipemia   . Hypertension   . Hypokalemia   . Hypothyroidism   . Lung mass   . Pulmonary hypertension (HCC)     Past Surgical History:  Procedure Laterality Date  . ABDOMINAL HYSTERECTOMY    . CORONARY ARTERY BYPASS GRAFT     [place] cardiology/Dr Roarbach     History  Smoking Status  . Current Every Day Smoker  . Types: Cigarettes  Smokeless Tobacco  . Never Used    History  Alcohol Use No    Social History   Social History  . Marital status: Widowed    Spouse name: N/A  . Number of children: N/A  . Years of education: N/A   Occupational History  . Not on file.   Social History Main Topics  .  Smoking status: Current Every Day Smoker    Types: Cigarettes  . Smokeless tobacco: Never Used  . Alcohol use No  . Drug use: No  . Sexual activity: No   Other Topics Concern  . Not on file   Social History Narrative  . No narrative on file    Allergies  Allergen Reactions  . Bupropion Tinitus  . Opium Palpitations and Other (See Comments)    Increased heart rate  . Azithromycin Rash  . Ciprofloxacin Other (See Comments)    Bruising  . Penicillins   . Sulfa Antibiotics   . Amoxicillin Rash  . Clindamycin/Lincomycin Rash  . Codeine Phosphate [Codeine] Rash  . Penicillin G Rash  . Sulfamethoxazole Rash    Current Facility-Administered Medications  Medication Dose Route Frequency Provider Last Rate Last Dose  . 0.9 %  sodium chloride infusion   Intravenous Continuous Ralene Muskraturpin, Pamela, PA-C      . fentaNYL (SUBLIMAZE) injection   Intravenous PRN Simonne ComeWatts, John, MD   25 mcg at 08/01/16 0919  . ketorolac (TORADOL) 15 MG/ML injection 15 mg   15 mg Intravenous Q6H Junious SilkEllis, Allison L, NP      . morphine 2 MG/ML injection 1-4 mg  1-4 mg Intravenous Q2H PRN Russella DarEllis, Allison L, NP      . oxyCODONE (Oxy IR/ROXICODONE) immediate release tablet 5 mg  5 mg Oral Q4H PRN Russella DarEllis, Allison L, NP      . pantoprazole (PROTONIX) EC tablet 40 mg  40 mg Oral Daily Russella DarEllis, Allison L, NP        Prescriptions Prior to Admission  Medication Sig Dispense Refill Last Dose  . acetaminophen (TYLENOL) 500 MG tablet Take 500-1,000 mg by mouth every 6 (six) hours as needed for mild pain or moderate pain.    Taking  . albuterol (PROVENTIL HFA;VENTOLIN HFA) 108 (90 Base) MCG/ACT inhaler Inhale 2 puffs into the lungs every 4 (four) hours as needed for wheezing or shortness of breath.    07/31/2016 at 2000  . ALPRAZolam (XANAX) 1 MG tablet Take 1 mg by mouth at bedtime.    07/31/2016 at 2000  . aspirin EC 81 MG tablet Take 81 mg by mouth at bedtime.    07/27/2016  . atorvastatin (LIPITOR) 80 MG tablet Take 80 mg by mouth at bedtime.    07/31/2016 at 0800  . budesonide-formoterol (SYMBICORT) 160-4.5 MCG/ACT inhaler Inhale 2 puffs into the lungs 2 (two) times daily.    07/31/2016 at 2000  . Calcium-Phosphorus-Vitamin D (CALCIUM/D3 ADULT GUMMIES PO) Take 2 each by mouth daily.   07/31/2016 at 200  . Cholecalciferol (VITAMIN D3) 1000 units CAPS Take 1 capsule by mouth at bedtime.    07/31/2016 at 2000  . clopidogrel (PLAVIX) 75 MG tablet TAKE 1 TABLET BY MOUTH DAILY   07/27/2016  . ferrous sulfate 325 (65 FE) MG EC tablet Take 325 mg by mouth at bedtime.   07/31/2016 at 2000  . FIBER ADULT GUMMIES PO Take 2 each by mouth at bedtime.     Marland Kitchen. FLUoxetine (PROZAC) 40 MG capsule Take 80 mg by mouth every morning.    07/31/2016 at 0800  . levothyroxine (SYNTHROID, LEVOTHROID) 88 MCG tablet TAKE 1 TABLET EVERY DAY  AT  6AM   07/31/2016 at 0800  . levothyroxine (SYNTHROID, LEVOTHROID) 88 MCG tablet Take 88 mcg by mouth daily before breakfast.     . lisinopril (PRINIVIL,ZESTRIL) 10 MG tablet Take  10 mg by mouth every morning.   07/31/2016 at  0800  . metoprolol succinate (TOPROL-XL) 50 MG 24 hr tablet Take 50 mg by mouth every morning.    07/31/2016 at 0800  . nitroGLYCERIN (NITROSTAT) 0.4 MG SL tablet PLACE ONE TABLET UNDER THE TONGUE EVERY FIVE MINUTES AS NEEDED FOR CHEST PAIN AS DIRECTED   Taking  . omeprazole (PRILOSEC) 40 MG capsule Take 40 mg by mouth every morning.    07/31/2016 at 0800  . sennosides-docusate sodium (SENOKOT-S) 8.6-50 MG tablet Take 1 tablet by mouth at bedtime.     . Tiotropium Bromide Monohydrate (SPIRIVA RESPIMAT) 2.5 MCG/ACT AERS Inhale 2 puffs into the lungs daily.    07/31/2016 at 0800     Review of Systems:  Pertinent items are noted in HPI.     Cardiac Review of Systems: Y or N  Chest Pain [  N  ]  Resting SOB [ N  ] Exertional SOB  [  ]  Orthopnea [  ]   Pedal Edema [ N  ]    Palpitations Klaus.Mock  ] Syncope  [ N ]   Presyncope [   ]  General Review of Systems: [Y] = yes [  ]=no Constitional: recent weight change [  ]; anorexia [  ]; fatigue [  ]; nausea [  ]; night sweats [  ]; fever [ N ]; or chills Klaus.Mock  ]                                                               Dental: poor dentition[  ]; Last Dentist visit:   Eye : blurred vision [  ]; diplopia [   ]; vision changes [ N ];  Amaurosis fugax[  ]; Resp: cough [  ];  wheezing[ N ];  hemoptysis[  ]; shortness of breath[ Y ]; paroxysmal nocturnal dyspnea[  ]; dyspnea on exertion[  ]; or orthopnea[  ];  GI:  gallstones[  ], vomiting[  ];  dysphagia[  ]; melena[  ];  hematochezia [  ]; heartburn[  ];   Hx of  Colonoscopy[  ]; GU: kidney stones [  ]; hematuria[  ];   dysuria [  ];  nocturia[  ];  history of     obstruction [  ]; urinary frequency Klaus.Mock  ]             Skin: rash, swelling[  ];, hair loss[  ];  peripheral edema[  ];  or itching[  ]; Musculosketetal: myalgias[  ];  joint swelling[ N ];  joint erythema[  ];  joint pain[  ];  back pain[  ];  Heme/Lymph: bruising[  ];  bleeding[  ];  anemia[  ];  Neuro:  TIA[  ];  headaches[ N ];  stroke[  ];  vertigo[  ];  seizures[  ];   paresthesias[  ];  difficulty walking[ Y ];  Psych:depression[  ]; anxiety[ Y ];  Endocrine: diabetes[ N ];  thyroid dysfunction[ Y ];  Immunizations: Flu [  ]; Pneumococcal[  ];  Other:  Physical Exam: BP 120/74 (BP Location: Left Arm)   Pulse 70   Temp 98.3 F (36.8 C) (Oral)   Resp 19   Ht 5' 5.5" (1.664 m)   Wt 56.7 kg (125 lb)   SpO2 96%  BMI 20.48 kg/m    General appearance: alert, cooperative and no distress Resp: clear to auscultation bilaterally Cardio: regular rate and rhythm, S1, S2 normal, no murmur, click, rub or gallop Extremities: extremities normal, atraumatic, no cyanosis or edema  Diagnostic Studies & Laboratory data: CT Biopsy results pending     Recent Radiology Findings:  Pending   Recent Lab Findings: Lab Results  Component Value Date   WBC 5.3 08/01/2016   HGB 13.4 08/01/2016   HCT 41.7 08/01/2016   PLT 205 08/01/2016   INR 0.93 08/01/2016      Assessment / Plan:   Continue medical management per internal medicine. Continue chest tube management per interventional radiology. We will continue to follow along with you. Continue incentive spirometry and wean oxygen when appropriate.      I  spent 30 minutes counseling the patient face to face and 50% or more the  time was spent in counseling and coordination of care. The total time spent in the appointment was 60 minutes.    Jari Favre, PA-C  08/01/2016 10:15 AM   Patient seen , films reviewed Ct small bore with no air leak I have seen and examined Venia Carbon and agree with the above assessment  and plan.  Delight Ovens MD Beeper [phone] Office [phone] 08/01/2016 5:23 PM

## 2016-08-02 ENCOUNTER — Observation Stay (HOSPITAL_COMMUNITY): Payer: Medicare Other

## 2016-08-02 DIAGNOSIS — Z79899 Other long term (current) drug therapy: Secondary | ICD-10-CM | POA: Diagnosis not present

## 2016-08-02 DIAGNOSIS — Z720 Tobacco use: Secondary | ICD-10-CM | POA: Diagnosis not present

## 2016-08-02 DIAGNOSIS — Z881 Allergy status to other antibiotic agents status: Secondary | ICD-10-CM | POA: Diagnosis not present

## 2016-08-02 DIAGNOSIS — E785 Hyperlipidemia, unspecified: Secondary | ICD-10-CM | POA: Diagnosis present

## 2016-08-02 DIAGNOSIS — E039 Hypothyroidism, unspecified: Secondary | ICD-10-CM | POA: Diagnosis present

## 2016-08-02 DIAGNOSIS — J431 Panlobular emphysema: Secondary | ICD-10-CM | POA: Diagnosis not present

## 2016-08-02 DIAGNOSIS — Y839 Surgical procedure, unspecified as the cause of abnormal reaction of the patient, or of later complication, without mention of misadventure at the time of the procedure: Secondary | ICD-10-CM | POA: Diagnosis present

## 2016-08-02 DIAGNOSIS — Z7982 Long term (current) use of aspirin: Secondary | ICD-10-CM | POA: Diagnosis not present

## 2016-08-02 DIAGNOSIS — J95811 Postprocedural pneumothorax: Secondary | ICD-10-CM | POA: Diagnosis not present

## 2016-08-02 DIAGNOSIS — F17210 Nicotine dependence, cigarettes, uncomplicated: Secondary | ICD-10-CM | POA: Diagnosis present

## 2016-08-02 DIAGNOSIS — E038 Other specified hypothyroidism: Secondary | ICD-10-CM | POA: Diagnosis not present

## 2016-08-02 DIAGNOSIS — Z882 Allergy status to sulfonamides status: Secondary | ICD-10-CM | POA: Diagnosis not present

## 2016-08-02 DIAGNOSIS — J449 Chronic obstructive pulmonary disease, unspecified: Secondary | ICD-10-CM | POA: Diagnosis present

## 2016-08-02 DIAGNOSIS — I272 Pulmonary hypertension, unspecified: Secondary | ICD-10-CM | POA: Diagnosis not present

## 2016-08-02 DIAGNOSIS — Z888 Allergy status to other drugs, medicaments and biological substances status: Secondary | ICD-10-CM | POA: Diagnosis not present

## 2016-08-02 DIAGNOSIS — Z79890 Hormone replacement therapy (postmenopausal): Secondary | ICD-10-CM | POA: Diagnosis not present

## 2016-08-02 DIAGNOSIS — I2510 Atherosclerotic heart disease of native coronary artery without angina pectoris: Secondary | ICD-10-CM | POA: Diagnosis not present

## 2016-08-02 DIAGNOSIS — I10 Essential (primary) hypertension: Secondary | ICD-10-CM | POA: Diagnosis not present

## 2016-08-02 DIAGNOSIS — Z880 Allergy status to penicillin: Secondary | ICD-10-CM | POA: Diagnosis not present

## 2016-08-02 DIAGNOSIS — K219 Gastro-esophageal reflux disease without esophagitis: Secondary | ICD-10-CM | POA: Diagnosis present

## 2016-08-02 LAB — BASIC METABOLIC PANEL
ANION GAP: 5 (ref 5–15)
BUN: 9 mg/dL (ref 6–20)
CHLORIDE: 104 mmol/L (ref 101–111)
CO2: 29 mmol/L (ref 22–32)
Calcium: 8.3 mg/dL — ABNORMAL LOW (ref 8.9–10.3)
Creatinine, Ser: 0.86 mg/dL (ref 0.44–1.00)
GFR calc Af Amer: >60 mL/min (ref >60)
GFR calc non Af Amer: >60 mL/min (ref >60)
GLUCOSE: 111 mg/dL — AB (ref 65–99)
POTASSIUM: 4.3 mmol/L (ref 3.5–5.1)
Sodium: 138 mmol/L (ref 135–145)

## 2016-08-02 LAB — CBC
HEMATOCRIT: 37 % (ref 36.0–46.0)
Hemoglobin: 11.7 g/dL — ABNORMAL LOW (ref 12.0–15.0)
MCH: 29.5 pg (ref 26.0–34.0)
MCHC: 31.6 g/dL (ref 30.0–36.0)
MCV: 93.2 fL (ref 78.0–100.0)
PLATELETS: 159 10*3/uL (ref 150–400)
RBC: 3.97 MIL/uL (ref 3.87–5.11)
RDW: 13.2 % (ref 11.5–15.5)
WBC: 4.2 10*3/uL (ref 4.0–10.5)

## 2016-08-02 LAB — MRSA PCR SCREENING: MRSA by PCR: POSITIVE — AB

## 2016-08-02 MED ORDER — CHLORHEXIDINE GLUCONATE CLOTH 2 % EX PADS
6.0000 | MEDICATED_PAD | Freq: Every day | CUTANEOUS | Status: DC
  Administered 2016-08-03: 6 via TOPICAL

## 2016-08-02 MED ORDER — MUPIROCIN 2 % EX OINT
1.0000 "application " | TOPICAL_OINTMENT | Freq: Two times a day (BID) | CUTANEOUS | Status: DC
  Administered 2016-08-02 – 2016-08-03 (×3): 1 via NASAL
  Filled 2016-08-02 (×2): qty 22

## 2016-08-02 NOTE — Progress Notes (Signed)
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   Patient underwent lung cancer screening/CT of the chest as ordered by herulmonologist and was found to have a lung nodule on the left that was irregularly shaped and measured 3.7 x 2.1 x 1.8 cm. A follow-up PET scan demonstrated persistence of this area but had decreased in size. She was evaluated by Dr. Tyrone SageGerhardt with [facility] in April after undergoing a follow-up scan that demonstrated significant decrease in size of that lesion (? Inflammatory etiology) with interval development of a new left lower lobe pulmonary nodule with adjacent tiny satellite nodules which was more concerning for neoplasm therefore patient was referred to interventional radiology for biopsy procedure. Of note in the past 3 months patient has resumed smoking 2-3 cigarettes per day after having quit in 2000. Patient underwent lung biopsy today in interventional radiology but developed a post procedure pneumothorax requiring chest tube placement. Patient was referred to the hospitalist service for admission with intervention radiology will be managing the chest tube.   Subjective: 6/29 A/O 4, negative SOB, negative N/V, negative abdominal pain, positive CP where chest tube enters patient's chest wall on the left   Assessment & Plan:   Principal Problem:   Pneumothorax of left lung after biopsy Active Problems:   Hypertension   Pulmonary hypertension (HCC)   CAD (coronary artery disease)/history of CABG in 2000   GERD (gastroesophageal reflux disease)   Hypothyroidism   Lung mass   COPD with hypoxia (HCC)   Pneumothorax of left lung after biopsy for Lung mass -Patient underwent IR directed lung biopsy with development of post procedure  pneumothorax requiring placement of chest tube -Management of chest tube per IR -Patient endorsing pleuritic chest discomfort with inspiratory effort and visibly splinting therefore will begin pain management as follows:   Scheduled IV Toradol with PPI   Low-dose OxyIR for moderate pain   IV morphine for severe pain -Incentive spirometry -Admit to stepdown     Hypertension -Blood pressure currently controlled -Continue preadmission lisinopril and metoprolol    Pulmonary hypertension/COPD with hypoxia  -Continue nocturnal O2 at 2 L/m titrated maintain SPO2 89-93% -Continue Symbicort, albuterol and Spiriva    CAD (coronary artery disease)/history of CABG in 2000 -Currently asymptomatic -Continue baby aspirin, Plavix, statin, beta blocker     Tobacco abuse -Cessation counseling discussed -Nicotine patch -Continue Xanax prn    GERD (gastroesophageal reflux disease) -Continue Protonix    Hypothyroidism -Continue Synthroid      DVT prophylaxis: SCD Code Status: Full Family Communication: None Disposition Plan: Per IR   Consultants:  IR Dr.Jay Watts,    Procedures/Significant Events:  6/28 IR  Left lower lung nodule biopsy    VENTILATOR SETTINGS: None   Cultures 6/28 left lower lung biopsy pending  Antimicrobials: Anti-infectives    None       Devices    LINES / TUBES:  6/28 Left chest tube>>    Continuous Infusions: . sodium chloride       Objective: Vitals:   08/02/16 0200 08/02/16 0441 08/02/16 0800 08/02/16 0833  BP:  (!) 122/52 137/67   Pulse: (!) 58 (!) 59 71   Resp: 15 16 18    Temp:  97.8 F (36.6 C) 97.7 F (36.5 C)   TempSrc:  Oral Oral   SpO2: 91% 90% 94% 98%  Weight:      Height:        Intake/Output Summary (Last 24 hours) at 08/02/16 1110 Last data filed at 08/02/16 0600  Gross per 24 hour  Intake              840 ml  Output              440 ml  Net              400 ml   Filed Weights   08/01/16 1610  08/01/16 1117  Weight: 125 lb (56.7 kg) 127 lb 14.4 oz (58 kg)    Examination:  General: A/O 4, No acute respiratory distress Eyes: negative scleral hemorrhage, negative anisocoria, negative icterus ENT: Negative Runny nose, negative gingival bleeding, Neck:  Negative scars, masses, torticollis, lymphadenopathy, JVD Lungs: Clear to auscultation bilaterally without wheezes or crackles Cardiovascular: Regular rate and rhythm without murmur gallop or rub normal S1 and S2 Abdomen: negative abdominal pain, nondistended, positive soft, bowel sounds, no rebound, no ascites, no appreciable mass Extremities: No significant cyanosis, clubbing, or edema bilateral lower extremities Skin: Negative rashes, lesions, ulcers Psychiatric:  Negative depression, negative anxiety, negative fatigue, negative mania  Central nervous system:  Cranial nerves II through XII intact, tongue/uvula midline, all extremities muscle strength 5/5, sensation intact throughout,  negative dysarthria, negative expressive aphasia, negative receptive aphasia.  .     Data Reviewed: Care during the described time interval was provided by me .  I have reviewed this patient's available data, including medical history, events of note, physical examination, and all test results as part of my evaluation. I have personally reviewed and interpreted all radiology studies.  CBC:  Recent Labs Lab 08/01/16 0600 08/02/16 0503  WBC 5.3 4.2  HGB 13.4 11.7*  HCT 41.7 37.0  MCV 92.3 93.2  PLT 205 159   Basic Metabolic Panel:  Recent Labs Lab 08/01/16 0615 08/02/16 0503  NA 142 138  K 4.1 4.3  CL 107 104  CO2 31 29  GLUCOSE 95 111*  BUN 11 9  CREATININE 0.79 0.86  CALCIUM 8.8* 8.3*  MG 2.2  --   PHOS 4.4  --    GFR: Estimated Creatinine Clearance: 53.3 mL/min (by C-G formula based on SCr of 0.86 mg/dL). Liver Function Tests: No results for input(s): AST, ALT, ALKPHOS, BILITOT, PROT, ALBUMIN in the last 168 hours. No  results for input(s): LIPASE, AMYLASE in the last 168 hours. No results for input(s): AMMONIA in the last 168 hours. Coagulation Profile:  Recent Labs Lab 08/01/16 0600  INR 0.93   Cardiac Enzymes: No results for input(s): CKTOTAL, CKMB, CKMBINDEX, TROPONINI in the last 168 hours. BNP (last 3 results) No results for input(s): PROBNP in the last 8760 hours. HbA1C: No results for input(s): HGBA1C in the last 72 hours. CBG: No results for input(s): GLUCAP in the last 168 hours. Lipid Profile: No results for input(s): CHOL, HDL, LDLCALC, TRIG, CHOLHDL, LDLDIRECT in the last 72 hours. Thyroid Function Tests: No results for input(s): TSH, T4TOTAL, FREET4, T3FREE, THYROIDAB in the last 72 hours. Anemia Panel: No results for input(s): VITAMINB12, FOLATE, FERRITIN, TIBC, IRON, RETICCTPCT in the last 72 hours. Urine analysis: No results found for: COLORURINE, APPEARANCEUR, LABSPEC, PHURINE, GLUCOSEU, HGBUR, BILIRUBINUR, KETONESUR, PROTEINUR, UROBILINOGEN, NITRITE, LEUKOCYTESUR Sepsis Labs: @LABRCNTIP (procalcitonin:4,lacticidven:4)  ) Recent Results (from the past 240 hour(s))  Aerobic/Anaerobic Culture (surgical/deep wound)     Status: None (Preliminary  result)   Collection Time: 08/27/2016  9:42 AM  Result Value Ref Range Status   Specimen Description LUNG LEFT  Final   Special Requests   Final    LUNG LEFT LOWER LOBE WAXING AND WANING LEFT SIDED PULMONARY NODULES POST CT GUIDED BX   Gram Stain   Final    FEW WBC PRESENT, PREDOMINANTLY MONONUCLEAR NO ORGANISMS SEEN    Culture PENDING  Incomplete   Report Status PENDING  Incomplete         Radiology Studies: Ct Biopsy  Result Date: August 27, 2016 INDICATION: No known primary, now with waxing and waning left sided pulmonary nodules. Please perform CT-guided biopsy of macrolobulated dominant nodule within the left lower lobe for tissue diagnostic purposes. EXAM: 1. CT-GUIDED LEFT UPPER LOBE PULMONARY NODULE BIOPSY 2. CT-GUIDED  LEFT-SIDED CHEST TUBE PLACEMENT COMPARISON:  Chest CT - 05/10/2016; PET-CT - 04/12/2016 MEDICATIONS: None. ANESTHESIA/SEDATION: Fentanyl 100 mcg IV; Versed 1 mg IV Sedation time: 38 minutes; The patient was continuously monitored during the procedure by the interventional radiology nurse under my direct supervision. CONTRAST:  None COMPLICATIONS: SIR LEVEL C - Requires therapy, minor hospitalization (<48 hrs). Procedure complicated by development of an enlarging pneumothorax requiring chest tube placement. Fortunately, patient remained hemodynamically stable throughout the entirety of the procedure and chest tube placement. PROCEDURE: Informed consent was obtained from the patient following an explanation of the procedure, risks, benefits and alternatives. The patient understands,agrees and consents for the procedure. All questions were addressed. A time out was performed prior to the initiation of the procedure. The patient was positioned supine on the CT table and a limited chest CT was performed for procedural planning demonstrating a macrolobulated approximately 2.3 x 0.9 cm nodule within the peripheral aspect of the left upper lobe (image 26, series 2). The operative site was prepped and draped in the usual sterile fashion. Under sterile conditions and local anesthesia, a 17 gauge coaxial needle was advanced into the peripheral aspect of the nodule. Positioning was confirmed with intermittent CT fluoroscopy and followed by the acquisition of 2 core needle biopsies with an 18 gauge core needle biopsy device. The coaxial needle was removed following deployment of a Biosentry plug and superficial hemostasis was achieved with manual compression. Postprocedural imaging demonstrated development of a small left-sided pneumothorax. The patient was observed for 10 minutes on the CT gantry, however delayed imaging demonstrated interval expansion of the left-sided pneumothorax and as such, the decision was made to place a  chest tube. As such, a location along the left inferior anterolateral chest wall was anesthetized with 1% lidocaine. A 22 gauge spinal needle was utilized for procedural planning purposes. An 18 gauge trocar needle was advanced and a short Amplatz wire was coiled within the left pleural space. Appropriate position was confirmed with CT imaging. The track was dilated ultimately allowing placement of a 10 French percutaneous drainage catheter. Postprocedural imaging demonstrates appropriate positioning of the chest tube with end coiled and locked within the anterior inferior aspect the left pleural space. The chest tube was connected to a pleural vac device, secured in place with an interrupted suture and a Stat Lock device. A dressing was placed. The patient otherwise tolerated the procedure well without immediate postprocedural complication and remained hemodynamically stable throughout the procedure. The patient was escorted to have an upright chest radiograph. IMPRESSION: 1. Technically successful CT guided core needle core biopsy of indeterminate nodule within the left upper lobe. 2. Procedure complicated by development of an expanding left-sided pneumothorax requiring chest  tube placement. Electronically Signed   By: Simonne Come M.D.   On: 08/01/2016 14:48   Dg Chest Port 1 View  Result Date: 08/02/2016 CLINICAL DATA:  Follow-up Chest tube placement following lung biopsy EXAM: PORTABLE CHEST 1 VIEW COMPARISON:  08/01/2016 FINDINGS: Cardiac shadow is mildly enlarged but stable. Postsurgical changes are seen. Hyperinflation consistent with emphysema is noted. The known left lower lobe nodule is vaguely identified. A left basilar chest tube is seen. No pneumothorax is noted. No acute bony abnormality is seen. IMPRESSION: No evidence of pneumothorax. Chest tube in satisfactory position. Electronically Signed   By: Alcide Clever M.D.   On: 08/02/2016 07:18   Dg Chest Port 1 View  Result Date:  08/01/2016 CLINICAL DATA:  Status post lung biopsy. EXAM: PORTABLE CHEST 1 VIEW COMPARISON:  CT chest 08/01/2016 FINDINGS: Interval left lower lobe pulmonary nodule biopsy. Interval placement of a left basilar chest tube in satisfactory position. No residual pneumothorax. Mild bilateral interstitial thickening. Left lower lobe pulmonary nodule is not well visualized. No pleural effusion or pneumothorax. Heart and mediastinal contours are unremarkable. Prior CABG. No acute osseous abnormality. IMPRESSION: Interval placement of a left basilar chest tube. No residual pneumothorax. Electronically Signed   By: Elige Ko   On: 08/01/2016 10:53   Ct Image Guided Drainage By Percutaneous Catheter  Result Date: 08/01/2016 INDICATION: No known primary, now with waxing and waning left sided pulmonary nodules. Please perform CT-guided biopsy of macrolobulated dominant nodule within the left lower lobe for tissue diagnostic purposes. EXAM: 1. CT-GUIDED LEFT UPPER LOBE PULMONARY NODULE BIOPSY 2. CT-GUIDED LEFT-SIDED CHEST TUBE PLACEMENT COMPARISON:  Chest CT - 05/10/2016; PET-CT - 04/12/2016 MEDICATIONS: None. ANESTHESIA/SEDATION: Fentanyl 100 mcg IV; Versed 1 mg IV Sedation time: 38 minutes; The patient was continuously monitored during the procedure by the interventional radiology nurse under my direct supervision. CONTRAST:  None COMPLICATIONS: SIR LEVEL C - Requires therapy, minor hospitalization (<48 hrs). Procedure complicated by development of an enlarging pneumothorax requiring chest tube placement. Fortunately, patient remained hemodynamically stable throughout the entirety of the procedure and chest tube placement. PROCEDURE: Informed consent was obtained from the patient following an explanation of the procedure, risks, benefits and alternatives. The patient understands,agrees and consents for the procedure. All questions were addressed. A time out was performed prior to the initiation of the procedure. The  patient was positioned supine on the CT table and a limited chest CT was performed for procedural planning demonstrating a macrolobulated approximately 2.3 x 0.9 cm nodule within the peripheral aspect of the left upper lobe (image 26, series 2). The operative site was prepped and draped in the usual sterile fashion. Under sterile conditions and local anesthesia, a 17 gauge coaxial needle was advanced into the peripheral aspect of the nodule. Positioning was confirmed with intermittent CT fluoroscopy and followed by the acquisition of 2 core needle biopsies with an 18 gauge core needle biopsy device. The coaxial needle was removed following deployment of a Biosentry plug and superficial hemostasis was achieved with manual compression. Postprocedural imaging demonstrated development of a small left-sided pneumothorax. The patient was observed for 10 minutes on the CT gantry, however delayed imaging demonstrated interval expansion of the left-sided pneumothorax and as such, the decision was made to place a chest tube. As such, a location along the left inferior anterolateral chest wall was anesthetized with 1% lidocaine. A 22 gauge spinal needle was utilized for procedural planning purposes. An 18 gauge trocar needle was advanced and a short Amplatz wire  was coiled within the left pleural space. Appropriate position was confirmed with CT imaging. The track was dilated ultimately allowing placement of a 10 French percutaneous drainage catheter. Postprocedural imaging demonstrates appropriate positioning of the chest tube with end coiled and locked within the anterior inferior aspect the left pleural space. The chest tube was connected to a pleural vac device, secured in place with an interrupted suture and a Stat Lock device. A dressing was placed. The patient otherwise tolerated the procedure well without immediate postprocedural complication and remained hemodynamically stable throughout the procedure. The patient was  escorted to have an upright chest radiograph. IMPRESSION: 1. Technically successful CT guided core needle core biopsy of indeterminate nodule within the left upper lobe. 2. Procedure complicated by development of an expanding left-sided pneumothorax requiring chest tube placement. Electronically Signed   By: Simonne Come M.D.   On: 08/01/2016 14:48        Scheduled Meds: . ALPRAZolam  1 mg Oral QHS  . aspirin EC  81 mg Oral QHS  . atorvastatin  80 mg Oral QHS  . cholecalciferol  1,000 Units Oral QHS  . clopidogrel  75 mg Oral Daily  . ferrous sulfate  325 mg Oral QHS  . FLUoxetine  80 mg Oral Daily  . ketorolac  15 mg Intravenous Q6H  . levothyroxine  88 mcg Oral QAC breakfast  . lisinopril  10 mg Oral Q1200  . metoprolol succinate  50 mg Oral Daily  . mometasone-formoterol  2 puff Inhalation BID  . nicotine  21 mg Transdermal Daily  . pantoprazole  40 mg Oral Daily  . senna-docusate  1 tablet Oral QHS  . sodium chloride flush  3 mL Intravenous Q12H   Continuous Infusions: . sodium chloride       LOS: 0 days    Time spent: 40 minutes    Tayshawn Purnell, Roselind Messier, MD [facility] Pager [phone]   If 7PM-7AM, please contact night-coverage [email or web address] Password [email or web address] 08/02/2016, 11:10 AM

## 2016-08-02 NOTE — Progress Notes (Addendum)
[address]       [place] [address]             [phone]         Subjective: Feels pretty well, productive cough is improving  Objective: Vital signs in last 24 hours: Temp:  [97.7 F (36.5 C)-98.2 F (36.8 C)] 97.7 F (36.5 C) (06/29 0800) Pulse Rate:  [55-86] 71 (06/29 0800) Cardiac Rhythm: Normal sinus rhythm (06/29 0749) Resp:  [10-22] 18 (06/29 0800) BP: (100-137)/(43-88) 137/67 (06/29 0800) SpO2:  [89 %-99 %] 98 % (06/29 0833) Weight:  [127 lb 14.4 oz (58 kg)] 127 lb 14.4 oz (58 kg) (06/28 1117)  Hemodynamic parameters for last 24 hours:    Intake/Output from previous day: 06/28 0701 - 06/29 0700 In: 840 [P.O.:840] Out: 440 [Urine:400; Drains:40] Intake/Output this shift: No intake/output data recorded.  General appearance: alert, cooperative and no distress Heart: regular rate and rhythm Lungs: clear to auscultation bilaterally  Lab Results:  Recent Labs  08/01/16 0600 08/02/16 0503  WBC 5.3 4.2  HGB 13.4 11.7*  HCT 41.7 37.0  PLT 205 159   BMET:  Recent Labs  08/01/16 0615 08/02/16 0503  NA 142 138  K 4.1 4.3  CL 107 104  CO2 31 29  GLUCOSE 95 111*  BUN 11 9  CREATININE 0.79 0.86  CALCIUM 8.8* 8.3*    PT/INR:  Recent Labs  08/01/16 0600  LABPROT 12.4  INR 0.93   ABG No results found for: PHART, HCO3, TCO2, ACIDBASEDEF, O2SAT CBG (last 3)  No results for input(s): GLUCAP in the last 72 hours.  Meds Scheduled Meds: . ALPRAZolam  1 mg Oral QHS  . aspirin EC  81 mg Oral QHS  . atorvastatin  80 mg Oral QHS  . cholecalciferol  1,000 Units Oral QHS  . clopidogrel  75 mg Oral Daily  . ferrous sulfate  325 mg Oral QHS  . FLUoxetine  80 mg Oral Daily  . ketorolac  15 mg Intravenous Q6H  . levothyroxine  88 mcg Oral QAC breakfast  . lisinopril  10 mg Oral Q1200  . metoprolol succinate  50 mg Oral Daily  . mometasone-formoterol  2 puff Inhalation BID  . nicotine  21 mg Transdermal Daily  . pantoprazole   40 mg Oral Daily  . senna-docusate  1 tablet Oral QHS  . sodium chloride flush  3 mL Intravenous Q12H   Continuous Infusions: . sodium chloride     PRN Meds:.acetaminophen **OR** acetaminophen, albuterol, fentaNYL, morphine injection, ondansetron **OR** ondansetron (ZOFRAN) IV, oxyCODONE  Xrays Ct Biopsy  Result Date: 08/01/2016 INDICATION: No known primary, now with waxing and waning left sided pulmonary nodules. Please perform CT-guided biopsy of macrolobulated dominant nodule within the left lower lobe for tissue diagnostic purposes. EXAM: 1. CT-GUIDED LEFT UPPER LOBE PULMONARY NODULE BIOPSY 2. CT-GUIDED LEFT-SIDED CHEST TUBE PLACEMENT COMPARISON:  Chest CT - 05/10/2016; PET-CT - 04/12/2016 MEDICATIONS: None. ANESTHESIA/SEDATION: Fentanyl 100 mcg IV; Versed 1 mg IV Sedation time: 38 minutes; The patient was continuously monitored during the procedure by the interventional radiology nurse under my direct supervision. CONTRAST:  None COMPLICATIONS: SIR LEVEL C - Requires therapy, minor hospitalization (<48 hrs). Procedure complicated by development of an enlarging pneumothorax requiring chest tube placement. Fortunately, patient remained hemodynamically stable throughout the entirety of the procedure and chest tube placement. PROCEDURE: Informed consent was obtained from the patient following an explanation of the procedure, risks, benefits and alternatives. The patient  understands,agrees and consents for the procedure. All questions were addressed. A time out was performed prior to the initiation of the procedure. The patient was positioned supine on the CT table and a limited chest CT was performed for procedural planning demonstrating a macrolobulated approximately 2.3 x 0.9 cm nodule within the peripheral aspect of the left upper lobe (image 26, series 2). The operative site was prepped and draped in the usual sterile fashion. Under sterile conditions and local anesthesia, a 17 gauge coaxial needle  was advanced into the peripheral aspect of the nodule. Positioning was confirmed with intermittent CT fluoroscopy and followed by the acquisition of 2 core needle biopsies with an 18 gauge core needle biopsy device. The coaxial needle was removed following deployment of a Biosentry plug and superficial hemostasis was achieved with manual compression. Postprocedural imaging demonstrated development of a small left-sided pneumothorax. The patient was observed for 10 minutes on the CT gantry, however delayed imaging demonstrated interval expansion of the left-sided pneumothorax and as such, the decision was made to place a chest tube. As such, a location along the left inferior anterolateral chest wall was anesthetized with 1% lidocaine. A 22 gauge spinal needle was utilized for procedural planning purposes. An 18 gauge trocar needle was advanced and a short Amplatz wire was coiled within the left pleural space. Appropriate position was confirmed with CT imaging. The track was dilated ultimately allowing placement of a 10 French percutaneous drainage catheter. Postprocedural imaging demonstrates appropriate positioning of the chest tube with end coiled and locked within the anterior inferior aspect the left pleural space. The chest tube was connected to a pleural vac device, secured in place with an interrupted suture and a Stat Lock device. A dressing was placed. The patient otherwise tolerated the procedure well without immediate postprocedural complication and remained hemodynamically stable throughout the procedure. The patient was escorted to have an upright chest radiograph. IMPRESSION: 1. Technically successful CT guided core needle core biopsy of indeterminate nodule within the left upper lobe. 2. Procedure complicated by development of an expanding left-sided pneumothorax requiring chest tube placement. Electronically Signed   By: Simonne Come M.D.   On: 08/01/2016 14:48   Dg Chest Port 1 View  Result Date:  08/02/2016 CLINICAL DATA:  Follow-up Chest tube placement following lung biopsy EXAM: PORTABLE CHEST 1 VIEW COMPARISON:  08/01/2016 FINDINGS: Cardiac shadow is mildly enlarged but stable. Postsurgical changes are seen. Hyperinflation consistent with emphysema is noted. The known left lower lobe nodule is vaguely identified. A left basilar chest tube is seen. No pneumothorax is noted. No acute bony abnormality is seen. IMPRESSION: No evidence of pneumothorax. Chest tube in satisfactory position. Electronically Signed   By: Alcide Clever M.D.   On: 08/02/2016 07:18   Dg Chest Port 1 View  Result Date: 08/01/2016 CLINICAL DATA:  Status post lung biopsy. EXAM: PORTABLE CHEST 1 VIEW COMPARISON:  CT chest 08/01/2016 FINDINGS: Interval left lower lobe pulmonary nodule biopsy. Interval placement of a left basilar chest tube in satisfactory position. No residual pneumothorax. Mild bilateral interstitial thickening. Left lower lobe pulmonary nodule is not well visualized. No pleural effusion or pneumothorax. Heart and mediastinal contours are unremarkable. Prior CABG. No acute osseous abnormality. IMPRESSION: Interval placement of a left basilar chest tube. No residual pneumothorax. Electronically Signed   By: Elige Ko   On: 08/01/2016 10:53   Ct Image Guided Drainage By Percutaneous Catheter  Result Date: 08/01/2016 INDICATION: No known primary, now with waxing and waning left sided pulmonary  nodules. Please perform CT-guided biopsy of macrolobulated dominant nodule within the left lower lobe for tissue diagnostic purposes. EXAM: 1. CT-GUIDED LEFT UPPER LOBE PULMONARY NODULE BIOPSY 2. CT-GUIDED LEFT-SIDED CHEST TUBE PLACEMENT COMPARISON:  Chest CT - 05/10/2016; PET-CT - 04/12/2016 MEDICATIONS: None. ANESTHESIA/SEDATION: Fentanyl 100 mcg IV; Versed 1 mg IV Sedation time: 38 minutes; The patient was continuously monitored during the procedure by the interventional radiology nurse under my direct supervision.  CONTRAST:  None COMPLICATIONS: SIR LEVEL C - Requires therapy, minor hospitalization (<48 hrs). Procedure complicated by development of an enlarging pneumothorax requiring chest tube placement. Fortunately, patient remained hemodynamically stable throughout the entirety of the procedure and chest tube placement. PROCEDURE: Informed consent was obtained from the patient following an explanation of the procedure, risks, benefits and alternatives. The patient understands,agrees and consents for the procedure. All questions were addressed. A time out was performed prior to the initiation of the procedure. The patient was positioned supine on the CT table and a limited chest CT was performed for procedural planning demonstrating a macrolobulated approximately 2.3 x 0.9 cm nodule within the peripheral aspect of the left upper lobe (image 26, series 2). The operative site was prepped and draped in the usual sterile fashion. Under sterile conditions and local anesthesia, a 17 gauge coaxial needle was advanced into the peripheral aspect of the nodule. Positioning was confirmed with intermittent CT fluoroscopy and followed by the acquisition of 2 core needle biopsies with an 18 gauge core needle biopsy device. The coaxial needle was removed following deployment of a Biosentry plug and superficial hemostasis was achieved with manual compression. Postprocedural imaging demonstrated development of a small left-sided pneumothorax. The patient was observed for 10 minutes on the CT gantry, however delayed imaging demonstrated interval expansion of the left-sided pneumothorax and as such, the decision was made to place a chest tube. As such, a location along the left inferior anterolateral chest wall was anesthetized with 1% lidocaine. A 22 gauge spinal needle was utilized for procedural planning purposes. An 18 gauge trocar needle was advanced and a short Amplatz wire was coiled within the left pleural space. Appropriate position  was confirmed with CT imaging. The track was dilated ultimately allowing placement of a 10 French percutaneous drainage catheter. Postprocedural imaging demonstrates appropriate positioning of the chest tube with end coiled and locked within the anterior inferior aspect the left pleural space. The chest tube was connected to a pleural vac device, secured in place with an interrupted suture and a Stat Lock device. A dressing was placed. The patient otherwise tolerated the procedure well without immediate postprocedural complication and remained hemodynamically stable throughout the procedure. The patient was escorted to have an upright chest radiograph. IMPRESSION: 1. Technically successful CT guided core needle core biopsy of indeterminate nodule within the left upper lobe. 2. Procedure complicated by development of an expanding left-sided pneumothorax requiring chest tube placement. Electronically Signed   By: Simonne ComeJohn  Watts M.D.   On: 08/01/2016 14:48    Assessment/Plan:   1 doing well, mild discomfort from tube, productive cough is improving 2 Chest tube- no air leak, CXR without pntx- plans as outlined per radiology(IR) 3 medical management per primary    LOS: 0 days    GOLD,WAYNE E 08/02/2016  Path noted : Diagnosis Lung, needle/core biopsy(ies), Left Lower Lobe - GRANULOMATOUS INFLAMMATION WITH FOCAL NECROSIS. - THERE IS NO EVIDENCE OF MALIGNANCY. - SEE COMMENT. Microscopic Comment AFB, GMS, and PAS stains are negative for presence of microorganisms. (JBK:ah 08/02/16) JOSHUA KISH  MD  Consider Pulmonary consult for further treatment options  I have seen and examined [place] and agree with the above assessment  and plan.  Delight Ovens MD Beeper [phone] Office [phone] 08/02/2016 6:51 PM

## 2016-08-02 NOTE — Progress Notes (Signed)
Patient ID: Casey Moran, female   DOB: 01/13/1941, 76 y.o.   MRN: [id number]    Referring Physician(s): Delight OvensGerhardt,Edward B  Supervising Physician: Malachy MoanMcCullough, Heath  [facility]  Chief Complaint: PTX after lung bx  Subjective: Patient is complaining of some pain this morning from her chest tube.  She took some pain medication which is helping.  She denies SOB  Allergies: Bupropion; Opium; Azithromycin; Ciprofloxacin; Morphine and related; Penicillins; Sulfa antibiotics; Amoxicillin; Clindamycin/lincomycin; Codeine phosphate [codeine]; Penicillin g; and Sulfamethoxazole  Medications: Prior to Admission medications   Medication Sig Start Date End Date Taking? Authorizing Provider  acetaminophen (TYLENOL) 500 MG tablet Take 500-1,000 mg by mouth every 6 (six) hours as needed for mild pain or moderate pain.    Yes [provider]  albuterol (PROVENTIL HFA;VENTOLIN HFA) 108 (90 Base) MCG/ACT inhaler Inhale 2 puffs into the lungs every 4 (four) hours as needed for wheezing or shortness of breath.    Yes [provider]  ALPRAZolam Prudy Feeler(XANAX) 1 MG tablet Take 1 mg by mouth at bedtime.  02/16/16  Yes [provider]  aspirin EC 81 MG tablet Take 81 mg by mouth at bedtime.    Yes [provider]  atorvastatin (LIPITOR) 80 MG tablet Take 80 mg by mouth at bedtime.    Yes [provider]  budesonide-formoterol (SYMBICORT) 160-4.5 MCG/ACT inhaler Inhale 2 puffs into the lungs 2 (two) times daily.    Yes [provider]  Calcium-Phosphorus-Vitamin D (CALCIUM/D3 ADULT GUMMIES PO) Take 2 each by mouth daily.   Yes [provider]  Cholecalciferol (VITAMIN D3) 1000 units CAPS Take 1 capsule by mouth at bedtime.    Yes [provider]  clopidogrel (PLAVIX) 75 MG tablet TAKE 1 TABLET BY MOUTH DAILY 01/24/16  Yes [provider]  ferrous sulfate 325 (65 FE) MG EC tablet Take 325 mg by mouth at bedtime.   Yes  [provider]  FIBER ADULT GUMMIES PO Take 2 each by mouth at bedtime.   Yes [provider]  FLUoxetine (PROZAC) 40 MG capsule Take 80 mg by mouth every morning.    Yes [provider]  levothyroxine (SYNTHROID, LEVOTHROID) 88 MCG tablet TAKE 1 TABLET EVERY DAY  AT  6AM 02/27/16  Yes [provider]  lisinopril (PRINIVIL,ZESTRIL) 10 MG tablet Take 10 mg by mouth every morning.   Yes [provider]  metoprolol succinate (TOPROL-XL) 50 MG 24 hr tablet Take 50 mg by mouth every morning.  03/31/16  Yes [provider]  nitroGLYCERIN (NITROSTAT) 0.4 MG SL tablet PLACE ONE TABLET UNDER THE TONGUE EVERY FIVE MINUTES AS NEEDED FOR CHEST PAIN AS DIRECTED 04/04/15  Yes [provider]  omeprazole (PRILOSEC) 40 MG capsule Take 40 mg by mouth every morning.  04/15/16  Yes [provider]  sennosides-docusate sodium (SENOKOT-S) 8.6-50 MG tablet Take 1 tablet by mouth at bedtime.   Yes [provider]  Tiotropium Bromide Monohydrate (SPIRIVA RESPIMAT) 2.5 MCG/ACT AERS Inhale 2 puffs into the lungs daily.  02/20/16  Yes [provider]    Vital Signs: BP 137/67 (BP Location: Left Arm)   Pulse 71   Temp 97.7 F (36.5 C) (Oral)   Resp 18   Ht 5\' 6"  (1.676 m)   Wt 127 lb 14.4 oz (58 kg)   SpO2 98%   BMI 20.64 kg/m   Physical Exam: Chest: no air leak present.  Chest tube taken off suction and clamped.  Drain  site is c/d/i  Imaging: Ct Biopsy  Result Date: 08/01/2016 INDICATION: No known primary, now with waxing and waning left sided pulmonary nodules. Please perform CT-guided biopsy of macrolobulated dominant nodule within the left lower lobe for tissue diagnostic purposes. EXAM: 1. CT-GUIDED LEFT UPPER LOBE PULMONARY NODULE BIOPSY 2. CT-GUIDED LEFT-SIDED CHEST TUBE PLACEMENT COMPARISON:  Chest CT - 05/10/2016; PET-CT - 04/12/2016 MEDICATIONS: None. ANESTHESIA/SEDATION: Fentanyl 100 mcg IV; Versed 1 mg IV Sedation  time: 38 minutes; The patient was continuously monitored during the procedure by the interventional radiology nurse under my direct supervision. CONTRAST:  None COMPLICATIONS: SIR LEVEL C - Requires therapy, minor hospitalization (<48 hrs). Procedure complicated by development of an enlarging pneumothorax requiring chest tube placement. Fortunately, patient remained hemodynamically stable throughout the entirety of the procedure and chest tube placement. PROCEDURE: Informed consent was obtained from the patient following an explanation of the procedure, risks, benefits and alternatives. The patient understands,agrees and consents for the procedure. All questions were addressed. A time out was performed prior to the initiation of the procedure. The patient was positioned supine on the CT table and a limited chest CT was performed for procedural planning demonstrating a macrolobulated approximately 2.3 x 0.9 cm nodule within the peripheral aspect of the left upper lobe (image 26, series 2). The operative site was prepped and draped in the usual sterile fashion. Under sterile conditions and local anesthesia, a 17 gauge coaxial needle was advanced into the peripheral aspect of the nodule. Positioning was confirmed with intermittent CT fluoroscopy and followed by the acquisition of 2 core needle biopsies with an 18 gauge core needle biopsy device. The coaxial needle was removed following deployment of a Biosentry plug and superficial hemostasis was achieved with manual compression. Postprocedural imaging demonstrated development of a small left-sided pneumothorax. The patient was observed for 10 minutes on the CT gantry, however delayed imaging demonstrated interval expansion of the left-sided pneumothorax and as such, the decision was made to place a chest tube. As such, a location along the left inferior anterolateral chest wall was anesthetized with 1% lidocaine. A 22 gauge spinal needle was utilized for procedural  planning purposes. An 18 gauge trocar needle was advanced and a short Amplatz wire was coiled within the left pleural space. Appropriate position was confirmed with CT imaging. The track was dilated ultimately allowing placement of a 10 French percutaneous drainage catheter. Postprocedural imaging demonstrates appropriate positioning of the chest tube with end coiled and locked within the anterior inferior aspect the left pleural space. The chest tube was connected to a pleural vac device, secured in place with an interrupted suture and a Stat Lock device. A dressing was placed. The patient otherwise tolerated the procedure well without immediate postprocedural complication and remained hemodynamically stable throughout the procedure. The patient was escorted to have an upright chest radiograph. IMPRESSION: 1. Technically successful CT guided core needle core biopsy of indeterminate nodule within the left upper lobe. 2. Procedure complicated by development of an expanding left-sided pneumothorax requiring chest tube placement. Electronically Signed   By: Simonne Come M.D.   On: 08/01/2016 14:48   Dg Chest Port 1 View  Result Date: 08/02/2016 CLINICAL DATA:  Follow-up Chest tube placement following lung biopsy EXAM: PORTABLE CHEST 1 VIEW COMPARISON:  08/01/2016 FINDINGS: Cardiac shadow is mildly enlarged but stable. Postsurgical changes are seen. Hyperinflation consistent with emphysema is noted. The known left lower lobe nodule is vaguely identified. A left basilar chest tube is seen. No pneumothorax is noted. No acute  bony abnormality is seen. IMPRESSION: No evidence of pneumothorax. Chest tube in satisfactory position. Electronically Signed   By: Alcide Clever M.D.   On: 08/02/2016 07:18   Dg Chest Port 1 View  Result Date: 08/01/2016 CLINICAL DATA:  Status post lung biopsy. EXAM: PORTABLE CHEST 1 VIEW COMPARISON:  CT chest 08/01/2016 FINDINGS: Interval left lower lobe pulmonary nodule biopsy. Interval  placement of a left basilar chest tube in satisfactory position. No residual pneumothorax. Mild bilateral interstitial thickening. Left lower lobe pulmonary nodule is not well visualized. No pleural effusion or pneumothorax. Heart and mediastinal contours are unremarkable. Prior CABG. No acute osseous abnormality. IMPRESSION: Interval placement of a left basilar chest tube. No residual pneumothorax. Electronically Signed   By: Elige Ko   On: 08/01/2016 10:53   Ct Image Guided Drainage By Percutaneous Catheter  Result Date: 08/01/2016 INDICATION: No known primary, now with waxing and waning left sided pulmonary nodules. Please perform CT-guided biopsy of macrolobulated dominant nodule within the left lower lobe for tissue diagnostic purposes. EXAM: 1. CT-GUIDED LEFT UPPER LOBE PULMONARY NODULE BIOPSY 2. CT-GUIDED LEFT-SIDED CHEST TUBE PLACEMENT COMPARISON:  Chest CT - 05/10/2016; PET-CT - 04/12/2016 MEDICATIONS: None. ANESTHESIA/SEDATION: Fentanyl 100 mcg IV; Versed 1 mg IV Sedation time: 38 minutes; The patient was continuously monitored during the procedure by the interventional radiology nurse under my direct supervision. CONTRAST:  None COMPLICATIONS: SIR LEVEL C - Requires therapy, minor hospitalization (<48 hrs). Procedure complicated by development of an enlarging pneumothorax requiring chest tube placement. Fortunately, patient remained hemodynamically stable throughout the entirety of the procedure and chest tube placement. PROCEDURE: Informed consent was obtained from the patient following an explanation of the procedure, risks, benefits and alternatives. The patient understands,agrees and consents for the procedure. All questions were addressed. A time out was performed prior to the initiation of the procedure. The patient was positioned supine on the CT table and a limited chest CT was performed for procedural planning demonstrating a macrolobulated approximately 2.3 x 0.9 cm nodule within the  peripheral aspect of the left upper lobe (image 26, series 2). The operative site was prepped and draped in the usual sterile fashion. Under sterile conditions and local anesthesia, a 17 gauge coaxial needle was advanced into the peripheral aspect of the nodule. Positioning was confirmed with intermittent CT fluoroscopy and followed by the acquisition of 2 core needle biopsies with an 18 gauge core needle biopsy device. The coaxial needle was removed following deployment of a Biosentry plug and superficial hemostasis was achieved with manual compression. Postprocedural imaging demonstrated development of a small left-sided pneumothorax. The patient was observed for 10 minutes on the CT gantry, however delayed imaging demonstrated interval expansion of the left-sided pneumothorax and as such, the decision was made to place a chest tube. As such, a location along the left inferior anterolateral chest wall was anesthetized with 1% lidocaine. A 22 gauge spinal needle was utilized for procedural planning purposes. An 18 gauge trocar needle was advanced and a short Amplatz wire was coiled within the left pleural space. Appropriate position was confirmed with CT imaging. The track was dilated ultimately allowing placement of a 10 French percutaneous drainage catheter. Postprocedural imaging demonstrates appropriate positioning of the chest tube with end coiled and locked within the anterior inferior aspect the left pleural space. The chest tube was connected to a pleural vac device, secured in place with an interrupted suture and a Stat Lock device. A dressing was placed. The patient otherwise tolerated the procedure well without  immediate postprocedural complication and remained hemodynamically stable throughout the procedure. The patient was escorted to have an upright chest radiograph. IMPRESSION: 1. Technically successful CT guided core needle core biopsy of indeterminate nodule within the left upper lobe. 2. Procedure  complicated by development of an expanding left-sided pneumothorax requiring chest tube placement. Electronically Signed   By: Simonne Come M.D.   On: 08/01/2016 14:48    Labs:  CBC:  Recent Labs  08/01/16 0600 08/02/16 0503  WBC 5.3 4.2  HGB 13.4 11.7*  HCT 41.7 37.0  PLT 205 159    COAGS:  Recent Labs  08/01/16 0600  INR 0.93  APTT 29    BMP:  Recent Labs  08/01/16 0615 08/02/16 0503  NA 142 138  K 4.1 4.3  CL 107 104  CO2 31 29  GLUCOSE 95 111*  BUN 11 9  CALCIUM 8.8* 8.3*  CREATININE 0.79 0.86  GFRNONAA >60 >60  GFRAA >60 >60    LIVER FUNCTION TESTS: No results for input(s): BILITOT, AST, ALT, ALKPHOS, PROT, ALBUMIN in the last 8760 hours.  Assessment and Plan: 1. PTX after lung bx on 6/28  CXR today shows no PTX.  Her chest tube will be clamped and then a repeat CXR will be taken at 12pm.  If this is stable then we will likely remove her chest tube and she can likely be DC home after that is ok with medicine.  Electronically Signed: Letha Cape 08/02/2016, 9:14 AM   I spent a total of 15 Minutes at the the patient's bedside AND on the patient's hospital floor or unit, greater than 50% of which was counseling/coordinating care for PTX after lung bx

## 2016-08-02 NOTE — Progress Notes (Signed)
Patient ID: Venia CarbonDonna J Moran, female   DOB: 09/02/1940, 76 y.o.   MRN: [id number]  CXR after chest tube removal: IMPRESSION: No pneumothorax after chest tube removal. Minimal residual atelectasis at the left lung base. No change in the left lung mass  Can be discharged to home from IR stand point

## 2016-08-02 NOTE — Plan of Care (Signed)
Problem: Pain Managment: Goal: General experience of comfort will improve Outcome: Progressing Verbalized undrstanding  Problem: Physical Regulation: Goal: Ability to maintain clinical measurements within normal limits will improve Outcome: Progressing Verbalized undrstanding Goal: Will remain free from infection Outcome: Progressing Verbalized undrstanding  Problem: Skin Integrity: Goal: Risk for impaired skin integrity will decrease Outcome: Progressing Verbalized undrstanding  Problem: Tissue Perfusion: Goal: Risk factors for ineffective tissue perfusion will decrease Outcome: Progressing Verbalized undrstanding  Problem: Activity: Goal: Risk for activity intolerance will decrease Outcome: Progressing Verbalized undrstanding  Problem: Fluid Volume: Goal: Ability to maintain a balanced intake and output will improve Outcome: Progressing Verbalized undrstanding  Problem: Nutrition: Goal: Adequate nutrition will be maintained Outcome: Progressing Verbalized undrstanding  Problem: Bowel/Gastric: Goal: Will not experience complications related to bowel motility Outcome: Progressing Verbalized undrstanding  Problem: Activity: Goal: Ability to return to normal activity level will improve Outcome: Progressing Verbalized undrstanding  Problem: Bowel/Gastric: Goal: Gastrointestinal status for postoperative course will improve Outcome: Progressing Verbalized undrstanding  Problem: Education: Goal: Understanding of discharge needs will improve Outcome: Progressing Verbalized undrstanding  Problem: Health Behavior/Discharge Planning: Goal: Identification of resources available to assist in meeting health care needs will improve Outcome: Progressing Verbalized undrstanding  Problem: Physical Regulation: Goal: Postoperative complications will be avoided or minimized Outcome: Progressing Verbalized undrstanding

## 2016-08-02 NOTE — Progress Notes (Signed)
Referring Physician(s): Delight Ovens  Supervising Physician: Malachy Moan  Patient Status:  [facility] - In-pt  Chief Complaint:  Left lung mass biopsy 6/28 in IR: Pre procedural Dx: Lung Nodule Post procedural Dx: Same Technically successful CT guided biopsy of indeterminate left lower lobe pulmonary nodule EBL: None.  Complications: Procedure complicated by development of left sided chest tube requiring CT guided chest tube placement.   Subjective:  CXR at 1130 am: IMPRESSION: 1. Slight retraction of left-sided chest tube without evidence of recurrent pneumothorax. 2. Grossly unchanged appearance of known ill-defined heterogeneous nodular airspace opacities within the left lower lung. No new focal airspace opacities.  Pt up in bed Eating well Resting Only complaint is some pain at chest tube site. Breathing easily  Will remove chest tube now per Dr Archer Asa plan  Allergies: Bupropion; Opium; Azithromycin; Ciprofloxacin; Morphine and related; Penicillins; Sulfa antibiotics; Amoxicillin; Clindamycin/lincomycin; Codeine phosphate [codeine]; Penicillin g; and Sulfamethoxazole  Medications: Prior to Admission medications   Medication Sig Start Date End Date Taking? Authorizing Provider  acetaminophen (TYLENOL) 500 MG tablet Take 500-1,000 mg by mouth every 6 (six) hours as needed for mild pain or moderate pain.    Yes [provider]  albuterol (PROVENTIL HFA;VENTOLIN HFA) 108 (90 Base) MCG/ACT inhaler Inhale 2 puffs into the lungs every 4 (four) hours as needed for wheezing or shortness of breath.    Yes [provider]  ALPRAZolam Prudy Feeler) 1 MG tablet Take 1 mg by mouth at bedtime.  02/16/16  Yes [provider]  aspirin EC 81 MG tablet Take 81 mg by mouth at bedtime.    Yes [provider]  atorvastatin (LIPITOR) 80 MG tablet Take 80 mg by mouth at bedtime.    Yes [provider]  budesonide-formoterol (SYMBICORT)  160-4.5 MCG/ACT inhaler Inhale 2 puffs into the lungs 2 (two) times daily.    Yes [provider]  Calcium-Phosphorus-Vitamin D (CALCIUM/D3 ADULT GUMMIES PO) Take 2 each by mouth daily.   Yes [provider]  Cholecalciferol (VITAMIN D3) 1000 units CAPS Take 1 capsule by mouth at bedtime.    Yes [provider]  clopidogrel (PLAVIX) 75 MG tablet TAKE 1 TABLET BY MOUTH DAILY 01/24/16  Yes [provider]  ferrous sulfate 325 (65 FE) MG EC tablet Take 325 mg by mouth at bedtime.   Yes [provider]  FIBER ADULT GUMMIES PO Take 2 each by mouth at bedtime.   Yes [provider]  FLUoxetine (PROZAC) 40 MG capsule Take 80 mg by mouth every morning.    Yes [provider]  levothyroxine (SYNTHROID, LEVOTHROID) 88 MCG tablet TAKE 1 TABLET EVERY DAY  AT  6AM 02/27/16  Yes [provider]  lisinopril (PRINIVIL,ZESTRIL) 10 MG tablet Take 10 mg by mouth every morning.   Yes [provider]  metoprolol succinate (TOPROL-XL) 50 MG 24 hr tablet Take 50 mg by mouth every morning.  03/31/16  Yes [provider]  nitroGLYCERIN (NITROSTAT) 0.4 MG SL tablet PLACE ONE TABLET UNDER THE TONGUE EVERY FIVE MINUTES AS NEEDED FOR CHEST PAIN AS DIRECTED 04/04/15  Yes [provider]  omeprazole (PRILOSEC) 40 MG capsule Take 40 mg by mouth every morning.  04/15/16  Yes [provider]  sennosides-docusate sodium (SENOKOT-S) 8.6-50 MG tablet Take 1 tablet by mouth at bedtime.   Yes [provider]  Tiotropium Bromide Monohydrate (SPIRIVA RESPIMAT) 2.5 MCG/ACT AERS Inhale 2 puffs into the lungs daily.  02/20/16  Yes [provider]     Vital Signs: BP 137/67 (BP Location: Left Arm)   Pulse 71   Temp 97.3 F (36.3 C) (Oral)   Resp 18   Ht 5\' 6"  (1.676 m)   Wt 127 lb 14.4 oz (58 kg)   SpO2 98%   BMI 20.64 kg/m   Physical Exam  Pulmonary/Chest: Effort normal and breath sounds normal.  Abdominal:  Soft.  Musculoskeletal: Normal range of motion.  Neurological: She is alert.  Skin: Skin is warm and dry.  Site of left chest tube is clean and dry NT no bleeding  Removed left chest tube at bedside without complication vaseline dressing applied  CXR pending  Psychiatric: She has a normal mood and affect. Her behavior is normal.  Nursing note and vitals reviewed.   Imaging: Ct Biopsy  Result Date: 08/01/2016 INDICATION: No known primary, now with waxing and waning left sided pulmonary nodules. Please perform CT-guided biopsy of macrolobulated dominant nodule within the left lower lobe for tissue diagnostic purposes. EXAM: 1. CT-GUIDED LEFT UPPER LOBE PULMONARY NODULE BIOPSY 2. CT-GUIDED LEFT-SIDED CHEST TUBE PLACEMENT COMPARISON:  Chest CT - 05/10/2016; PET-CT - 04/12/2016 MEDICATIONS: None. ANESTHESIA/SEDATION: Fentanyl 100 mcg IV; Versed 1 mg IV Sedation time: 38 minutes; The patient was continuously monitored during the procedure by the interventional radiology nurse under my direct supervision. CONTRAST:  None COMPLICATIONS: SIR LEVEL C - Requires therapy, minor hospitalization (<48 hrs). Procedure complicated by development of an enlarging pneumothorax requiring chest tube placement. Fortunately, patient remained hemodynamically stable throughout the entirety of the procedure and chest tube placement. PROCEDURE: Informed consent was obtained from the patient following an explanation of the procedure, risks, benefits and alternatives. The patient understands,agrees and consents for the procedure. All questions were addressed. A time out was performed prior to the initiation of the procedure. The patient was positioned supine on the CT table and a limited chest CT was performed for procedural planning demonstrating a macrolobulated approximately 2.3 x 0.9 cm nodule within the peripheral aspect of the left upper lobe (image 26, series 2). The operative site was prepped and draped in the usual  sterile fashion. Under sterile conditions and local anesthesia, a 17 gauge coaxial needle was advanced into the peripheral aspect of the nodule. Positioning was confirmed with intermittent CT fluoroscopy and followed by the acquisition of 2 core needle biopsies with an 18 gauge core needle biopsy device. The coaxial needle was removed following deployment of a Biosentry plug and superficial hemostasis was achieved with manual compression. Postprocedural imaging demonstrated development of a small left-sided pneumothorax. The patient was observed for 10 minutes on the CT gantry, however delayed imaging demonstrated interval expansion of the left-sided pneumothorax and as such, the decision was made to place a chest tube. As such, a location along the left inferior anterolateral chest wall was anesthetized with 1% lidocaine. A 22 gauge spinal needle was utilized for procedural planning purposes. An 18 gauge trocar needle was advanced and a short Amplatz wire was coiled within the left pleural space. Appropriate position was confirmed with CT imaging. The track was dilated ultimately allowing placement of a 10 French percutaneous drainage catheter. Postprocedural imaging demonstrates appropriate positioning of the chest tube with end coiled and locked within the anterior inferior aspect the left pleural space. The chest tube was connected to a pleural vac device, secured in place with an interrupted suture and a Stat Lock device. A dressing was placed. The patient otherwise tolerated the procedure well without immediate postprocedural complication  and remained hemodynamically stable throughout the procedure. The patient was escorted to have an upright chest radiograph. IMPRESSION: 1. Technically successful CT guided core needle core biopsy of indeterminate nodule within the left upper lobe. 2. Procedure complicated by development of an expanding left-sided pneumothorax requiring chest tube placement. Electronically  Signed   By: Simonne Come M.D.   On: 08/01/2016 14:48   Dg Chest Port 1 View  Result Date: 08/02/2016 CLINICAL DATA:  Pneumothorax following biopsy. EXAM: PORTABLE CHEST 1 VIEW COMPARISON:  Chest radiograph - earlier same day ; 08/01/2016; chest CT - 08/01/2016 FINDINGS: Unchanged cardiac silhouette and mediastinal contours with atherosclerotic plaque within the thoracic aorta. Post median sternotomy and CABG. Slight retraction of left-sided chest tube with radiopaque site marker now projected at the level of the left lateral chest wall. No recurrent pneumothorax. The lungs remain hyperexpanded. Unchanged trace left-sided effusion with associated left basilar opacities. Ill-defined left lower lung nodular heterogeneous opacities are unchanged. No new focal airspace opacities. No evidence of edema. No acute osseus abnormalities. IMPRESSION: 1. Slight retraction of left-sided chest tube without evidence of recurrent pneumothorax. 2. Grossly unchanged appearance of known ill-defined heterogeneous nodular airspace opacities within the left lower lung. No new focal airspace opacities. 3. Aortic Atherosclerosis (ICD10-I70.0) and Emphysema (ICD10-J43.9). Electronically Signed   By: Simonne Come M.D.   On: 08/02/2016 11:59   Dg Chest Port 1 View  Result Date: 08/02/2016 CLINICAL DATA:  Follow-up Chest tube placement following lung biopsy EXAM: PORTABLE CHEST 1 VIEW COMPARISON:  08/01/2016 FINDINGS: Cardiac shadow is mildly enlarged but stable. Postsurgical changes are seen. Hyperinflation consistent with emphysema is noted. The known left lower lobe nodule is vaguely identified. A left basilar chest tube is seen. No pneumothorax is noted. No acute bony abnormality is seen. IMPRESSION: No evidence of pneumothorax. Chest tube in satisfactory position. Electronically Signed   By: Alcide Clever M.D.   On: 08/02/2016 07:18   Dg Chest Port 1 View  Result Date: 08/01/2016 CLINICAL DATA:  Status post lung biopsy. EXAM:  PORTABLE CHEST 1 VIEW COMPARISON:  CT chest 08/01/2016 FINDINGS: Interval left lower lobe pulmonary nodule biopsy. Interval placement of a left basilar chest tube in satisfactory position. No residual pneumothorax. Mild bilateral interstitial thickening. Left lower lobe pulmonary nodule is not well visualized. No pleural effusion or pneumothorax. Heart and mediastinal contours are unremarkable. Prior CABG. No acute osseous abnormality. IMPRESSION: Interval placement of a left basilar chest tube. No residual pneumothorax. Electronically Signed   By: Elige Ko   On: 08/01/2016 10:53   Ct Image Guided Drainage By Percutaneous Catheter  Result Date: 08/01/2016 INDICATION: No known primary, now with waxing and waning left sided pulmonary nodules. Please perform CT-guided biopsy of macrolobulated dominant nodule within the left lower lobe for tissue diagnostic purposes. EXAM: 1. CT-GUIDED LEFT UPPER LOBE PULMONARY NODULE BIOPSY 2. CT-GUIDED LEFT-SIDED CHEST TUBE PLACEMENT COMPARISON:  Chest CT - 05/10/2016; PET-CT - 04/12/2016 MEDICATIONS: None. ANESTHESIA/SEDATION: Fentanyl 100 mcg IV; Versed 1 mg IV Sedation time: 38 minutes; The patient was continuously monitored during the procedure by the interventional radiology nurse under my direct supervision. CONTRAST:  None COMPLICATIONS: SIR LEVEL C - Requires therapy, minor hospitalization (<48 hrs). Procedure complicated by development of an enlarging pneumothorax requiring chest tube placement. Fortunately, patient remained hemodynamically stable throughout the entirety of the procedure and chest tube placement. PROCEDURE: Informed consent was obtained from the patient following an explanation of the procedure, risks, benefits and alternatives. The patient understands,agrees and consents for  the procedure. All questions were addressed. A time out was performed prior to the initiation of the procedure. The patient was positioned supine on the CT table and a limited  chest CT was performed for procedural planning demonstrating a macrolobulated approximately 2.3 x 0.9 cm nodule within the peripheral aspect of the left upper lobe (image 26, series 2). The operative site was prepped and draped in the usual sterile fashion. Under sterile conditions and local anesthesia, a 17 gauge coaxial needle was advanced into the peripheral aspect of the nodule. Positioning was confirmed with intermittent CT fluoroscopy and followed by the acquisition of 2 core needle biopsies with an 18 gauge core needle biopsy device. The coaxial needle was removed following deployment of a Biosentry plug and superficial hemostasis was achieved with manual compression. Postprocedural imaging demonstrated development of a small left-sided pneumothorax. The patient was observed for 10 minutes on the CT gantry, however delayed imaging demonstrated interval expansion of the left-sided pneumothorax and as such, the decision was made to place a chest tube. As such, a location along the left inferior anterolateral chest wall was anesthetized with 1% lidocaine. A 22 gauge spinal needle was utilized for procedural planning purposes. An 18 gauge trocar needle was advanced and a short Amplatz wire was coiled within the left pleural space. Appropriate position was confirmed with CT imaging. The track was dilated ultimately allowing placement of a 10 French percutaneous drainage catheter. Postprocedural imaging demonstrates appropriate positioning of the chest tube with end coiled and locked within the anterior inferior aspect the left pleural space. The chest tube was connected to a pleural vac device, secured in place with an interrupted suture and a Stat Lock device. A dressing was placed. The patient otherwise tolerated the procedure well without immediate postprocedural complication and remained hemodynamically stable throughout the procedure. The patient was escorted to have an upright chest radiograph. IMPRESSION:  1. Technically successful CT guided core needle core biopsy of indeterminate nodule within the left upper lobe. 2. Procedure complicated by development of an expanding left-sided pneumothorax requiring chest tube placement. Electronically Signed   By: Simonne ComeJohn  Watts M.D.   On: 08/01/2016 14:48    Labs:  CBC:  Recent Labs  08/01/16 0600 08/02/16 0503  WBC 5.3 4.2  HGB 13.4 11.7*  HCT 41.7 37.0  PLT 205 159    COAGS:  Recent Labs  08/01/16 0600  INR 0.93  APTT 29    BMP:  Recent Labs  08/01/16 0615 08/02/16 0503  NA 142 138  K 4.1 4.3  CL 107 104  CO2 31 29  GLUCOSE 95 111*  BUN 11 9  CALCIUM 8.8* 8.3*  CREATININE 0.79 0.86  GFRNONAA >60 >60  GFRAA >60 >60    LIVER FUNCTION TESTS: No results for input(s): BILITOT, AST, ALT, ALKPHOS, PROT, ALBUMIN in the last 8760 hours.  Assessment and Plan:  Left chest tube removed at 1325 vaseline dressing applied CXR now pending May be discharged from IR stand point if CXR stable  Electronically Signed: Keefe Zawistowski A, PA-C 08/02/2016, 1:26 PM   I spent a total of 15 Minutes at the the patient's bedside AND on the patient's hospital floor or unit, greater than 50% of which was counseling/coordinating care for left chest tube post left lung nodule biopsy

## 2016-08-03 DIAGNOSIS — J431 Panlobular emphysema: Secondary | ICD-10-CM

## 2016-08-03 DIAGNOSIS — Z720 Tobacco use: Secondary | ICD-10-CM

## 2016-08-03 DIAGNOSIS — I2510 Atherosclerotic heart disease of native coronary artery without angina pectoris: Secondary | ICD-10-CM

## 2016-08-03 DIAGNOSIS — E038 Other specified hypothyroidism: Secondary | ICD-10-CM

## 2016-08-03 MED ORDER — NICOTINE 21 MG/24HR TD PT24: 21.0000 mg | MEDICATED_PATCH | Freq: Every day | TRANSDERMAL | 0 refills | Status: AC

## 2016-08-03 MED ORDER — OXYCODONE HCL 5 MG PO TABS: 5.0000 mg | ORAL_TABLET | ORAL | 0 refills | Status: AC | PRN

## 2016-08-03 MED ORDER — PANTOPRAZOLE SODIUM 40 MG PO TBEC: 40.0000 mg | DELAYED_RELEASE_TABLET | Freq: Every day | ORAL | 0 refills | Status: AC

## 2016-08-03 NOTE — Progress Notes (Signed)
Pt discharged via wheelchair after d/c instructions and prescriptions given. All questions answered. Pt in no distress at time of discharge. All belongings sent with patient.

## 2016-08-03 NOTE — Discharge Summary (Signed)
Physician Discharge Summary  [name]:[id number] DOB: Dec 25, 1940 DOA: 08/01/2016  PCP: Elijio Miles., MD  Admit date: 08/01/2016 Discharge date: 08/03/2016  Time spent: 35 minutes  Recommendations for Outpatient Follow-up: Pneumothorax of left lung after biopsy for Lung mass -Patient underwent IR directed lung biopsy with development of post procedure pneumothorax requiring placement of chest tube -S/P chest tube removal IR, patiegernt completed ambulatory SPO2 does not qualify for home O2. -Stable for discharge home -Schedule follow-up in one week with Dr. Sheliah Plane for review of pathology of Left Lower Lobe lung biopsy  Hypertension -Blood pressure currently controlled -Discharge on Lisinopril and Metoprolol -Schedule Follow-up with Dr. Daneil Dolin 1-2 week follow-up biopsy left lung mass, HTN, MI hypertension, COPD  Pulmonary hypertension/COPD with hypoxia  -Continue nocturnal O2 at 2 L/m. Patient to titrate maintain SPO2 89-93% -Continue Symbicort, albuterol and Spiriva  CAD (coronary artery disease)/history of CABG in 2000 -Currently asymptomatic -Continue baby aspirin, Plavix, statin, beta blocker  Tobacco abuse -Cessation counseling discussed -Nicotine patch -Continue Xanax prn  GERD (gastroesophageal reflux disease) -Continue Protonix  Hypothyroidism -Continue Synthroid   Discharge Diagnoses:  Principal Problem:   Pneumothorax of left lung after biopsy Active Problems:   Hypertension   Pulmonary hypertension (HCC)   CAD (coronary artery disease)/history of CABG in 2000   GERD (gastroesophageal reflux disease)   Hypothyroidism   Lung mass   COPD with hypoxia (HCC)   Pneumothorax after biopsy   Tobacco abuse   Discharge Condition: Stable  Diet recommendation: Regular  Filed Weights   08/01/16 8119 08/01/16 1117  Weight: 125 lb (56.7 kg) 127 lb 14.4 oz (58 kg)    History of present illness:  Casey Moran Goodwinis a 76  y.o.WF PMHxAnxiety, CAD S/P CABG, HTN,Pulmonary HTN, Dyslipidemia, Hypothyroidism, COPD on nocturnal oxygen, Lung mass, Tobacco abuse.   Patient underwent lung cancer screening/CT of the chest as ordered by herulmonologist and was found to have a lung nodule on the left that was irregularly shaped and measured 3.7 x 2.1 x 1.8 cm. A follow-up PET scan demonstrated persistence of this area but had decreased in size. She was evaluated by Dr. Tyrone Sage with CVTS in April after undergoing a follow-up scan that demonstrated significant decrease in size of that lesion (?Inflammatory etiology)with interval development of a new left lower lobe pulmonary nodule with adjacent tiny satellite nodules which was more concerning for neoplasm therefore patient was referred to interventional radiology for biopsy procedure. Of note in the past 3 months patient has resumed smoking 2-3 cigarettes per day after having quit in 2000. Patient underwent lung biopsy today in interventional radiology but developed a post procedure pneumothorax requiring chest tube placement. Patient was referred to the hospitalist service for admission with intervention radiology will be managing the chest tube. During this hospitalization IR performed LLL lung biopsy, complicated by pneumothorax requiring placement of chest tube. S/P chest tube placement pneumothorax resolved and IR has released patient for discharge. Ambulatory SPO2 prior to discharge reveals no O2 requirement. Patient has been counseled on follow-up to obtain results of biopsy.        Procedures: 6/28 IR  Left lower lung nodule biopsy   Culture   6/28 LLL lung needle biopsy:GRANULOMATOUS INFLAMMATION WITH FOCAL NECROSIS. - THERE IS NO EVIDENCE OF MALIGNANCY.  Consultations: IR Dr.Jay Watts,   Antibiotics Anti-infectives    None       Discharge Exam: Vitals:   08/03/16 0500 08/03/16 0600 08/03/16 0800 08/03/16 0849  BP: (!) 127/56  128/78   Pulse: 61 62 73    Resp: 18 19 (!) 23   Temp: 98 F (36.7 C)     TempSrc: Oral     SpO2: 94% 94% 98% 98%  Weight:      Height:        General: A/O 4, No acute respiratory distress Eyes: negative scleral hemorrhage, negative anisocoria, negative icterus ENT: Negative Runny nose, negative gingival bleeding, Neck:  Negative scars, masses, torticollis, lymphadenopathy, JVD Lungs: Clear to auscultation bilaterally without wheezes or crackles Cardiovascular: Regular rate and rhythm without murmur gallop or rub normal S1 and S2    Discharge Instructions   Allergies as of 08/03/2016      Reactions   Bupropion Tinitus   Opium Palpitations, Other (See Comments)   Increased heart rate   Azithromycin Rash   Ciprofloxacin Other (See Comments)   Bruising   Morphine And Related Nausea And Vomiting   Pt states morphine makes her vomit  But hydrocodone and oxycodone are ok.    Adhesive [tape] Other (See Comments)   Causes blisters. From electrode adhesive.   Penicillins    Sulfa Antibiotics    Amoxicillin Rash   Clindamycin/lincomycin Rash   Codeine Phosphate [codeine] Rash   Penicillin G Rash   Sulfamethoxazole Rash      Medication List    STOP taking these medications   Vitamin D3 1000 units Caps     TAKE these medications   acetaminophen 500 MG tablet Commonly known as:  TYLENOL Take 500-1,000 mg by mouth every 6 (six) hours as needed for mild pain or moderate pain.   albuterol 108 (90 Base) MCG/ACT inhaler Commonly known as:  PROVENTIL HFA;VENTOLIN HFA Inhale 2 puffs into the lungs every 4 (four) hours as needed for wheezing or shortness of breath.   ALPRAZolam 1 MG tablet Commonly known as:  XANAX Take 1 mg by mouth at bedtime.   aspirin EC 81 MG tablet Take 81 mg by mouth at bedtime.   atorvastatin 80 MG tablet Commonly known as:  LIPITOR Take 80 mg by mouth at bedtime.   budesonide-formoterol 160-4.5 MCG/ACT inhaler Commonly known as:  SYMBICORT Inhale 2 puffs into the  lungs 2 (two) times daily.   CALCIUM/D3 ADULT GUMMIES PO Take 2 each by mouth daily.   clopidogrel 75 MG tablet Commonly known as:  PLAVIX TAKE 1 TABLET BY MOUTH DAILY   ferrous sulfate 325 (65 FE) MG EC tablet Take 325 mg by mouth at bedtime.   FIBER ADULT GUMMIES PO Take 2 each by mouth at bedtime.   FLUoxetine 40 MG capsule Commonly known as:  PROZAC Take 80 mg by mouth every morning.   levothyroxine 88 MCG tablet Commonly known as:  SYNTHROID, LEVOTHROID TAKE 1 TABLET EVERY DAY  AT  6AM   lisinopril 10 MG tablet Commonly known as:  PRINIVIL,ZESTRIL Take 10 mg by mouth every morning.   metoprolol succinate 50 MG 24 hr tablet Commonly known as:  TOPROL-XL Take 50 mg by mouth every morning.   nicotine 21 mg/24hr patch Commonly known as:  NICODERM CQ - dosed in mg/24 hours Place 1 patch (21 mg total) onto the skin daily.   nitroGLYCERIN 0.4 MG SL tablet Commonly known as:  NITROSTAT PLACE ONE TABLET UNDER THE TONGUE EVERY FIVE MINUTES AS NEEDED FOR CHEST PAIN AS DIRECTED   omeprazole 40 MG capsule Commonly known as:  PRILOSEC Take 40 mg by mouth every morning.   oxyCODONE 5 MG immediate release tablet  Commonly known as:  Oxy IR/ROXICODONE Take 1 tablet (5 mg total) by mouth every 4 (four) hours as needed for moderate pain.   pantoprazole 40 MG tablet Commonly known as:  PROTONIX Take 1 tablet (40 mg total) by mouth daily.   sennosides-docusate sodium 8.6-50 MG tablet Commonly known as:  SENOKOT-S Take 1 tablet by mouth at bedtime.   SPIRIVA RESPIMAT 2.5 MCG/ACT Aers Generic drug:  Tiotropium Bromide Monohydrate Inhale 2 puffs into the lungs daily.      Allergies  Allergen Reactions  . Bupropion Tinitus  . Opium Palpitations and Other (See Comments)    Increased heart rate  . Azithromycin Rash  . Ciprofloxacin Other (See Comments)    Bruising  . Morphine And Related Nausea And Vomiting    Pt states morphine makes her vomit  But hydrocodone and  oxycodone are ok.   . Adhesive [Tape] Other (See Comments)    Causes blisters. From electrode adhesive.  Marland Kitchen Penicillins   . Sulfa Antibiotics   . Amoxicillin Rash  . Clindamycin/Lincomycin Rash  . Codeine Phosphate [Codeine] Rash  . Penicillin G Rash  . Sulfamethoxazole Rash   Follow-up Information    Delight Ovens, MD. Schedule an appointment as soon as possible for a visit in 1 week(s).   Specialty:  Cardiothoracic Surgery Why:  Schedule follow-up in one week with Dr. Sheliah Plane for review of pathology of Left Lower Lobe lung biopsy Contact information: [address] [employer] [address] [place] [address] [phone]        Elijio Miles., MD. Schedule an appointment as soon as possible for a visit in 2 week(s).   Specialty:  Family Medicine Why:  Schedule Follow-up with Dr. Daneil Dolin 1-2 week follow-up biopsy left lung mass, HTN, MI hypertension, COPD Contact information: [address] [place] [address] [phone]            The results of significant diagnostics from this hospitalization (including imaging, microbiology, ancillary and laboratory) are listed below for reference.    Significant Diagnostic Studies: Ct Biopsy  Result Date: 18-Aug-2016 INDICATION: No known primary, now with waxing and waning left sided pulmonary nodules. Please perform CT-guided biopsy of macrolobulated dominant nodule within the left lower lobe for tissue diagnostic purposes. EXAM: 1. CT-GUIDED LEFT UPPER LOBE PULMONARY NODULE BIOPSY 2. CT-GUIDED LEFT-SIDED CHEST TUBE PLACEMENT COMPARISON:  Chest CT - 05/10/2016; PET-CT - 04/12/2016 MEDICATIONS: None. ANESTHESIA/SEDATION: Fentanyl 100 mcg IV; Versed 1 mg IV Sedation time: 38 minutes; The patient was continuously monitored during the procedure by the interventional radiology nurse under my direct supervision. CONTRAST:  None COMPLICATIONS: SIR LEVEL C - Requires therapy, minor hospitalization (<48 hrs). Procedure  complicated by development of an enlarging pneumothorax requiring chest tube placement. Fortunately, patient remained hemodynamically stable throughout the entirety of the procedure and chest tube placement. PROCEDURE: Informed consent was obtained from the patient following an explanation of the procedure, risks, benefits and alternatives. The patient understands,agrees and consents for the procedure. All questions were addressed. A time out was performed prior to the initiation of the procedure. The patient was positioned supine on the CT table and a limited chest CT was performed for procedural planning demonstrating a macrolobulated approximately 2.3 x 0.9 cm nodule within the peripheral aspect of the left upper lobe (image 26, series 2). The operative site was prepped and draped in the usual sterile fashion. Under sterile conditions and local anesthesia, a 17 gauge coaxial needle was advanced into the peripheral aspect  of the nodule. Positioning was confirmed with intermittent CT fluoroscopy and followed by the acquisition of 2 core needle biopsies with an 18 gauge core needle biopsy device. The coaxial needle was removed following deployment of a Biosentry plug and superficial hemostasis was achieved with manual compression. Postprocedural imaging demonstrated development of a small left-sided pneumothorax. The patient was observed for 10 minutes on the CT gantry, however delayed imaging demonstrated interval expansion of the left-sided pneumothorax and as such, the decision was made to place a chest tube. As such, a location along the left inferior anterolateral chest wall was anesthetized with 1% lidocaine. A 22 gauge spinal needle was utilized for procedural planning purposes. An 18 gauge trocar needle was advanced and a short Amplatz wire was coiled within the left pleural space. Appropriate position was confirmed with CT imaging. The track was dilated ultimately allowing placement of a 10 French  percutaneous drainage catheter. Postprocedural imaging demonstrates appropriate positioning of the chest tube with end coiled and locked within the anterior inferior aspect the left pleural space. The chest tube was connected to a pleural vac device, secured in place with an interrupted suture and a Stat Lock device. A dressing was placed. The patient otherwise tolerated the procedure well without immediate postprocedural complication and remained hemodynamically stable throughout the procedure. The patient was escorted to have an upright chest radiograph. IMPRESSION: 1. Technically successful CT guided core needle core biopsy of indeterminate nodule within the left upper lobe. 2. Procedure complicated by development of an expanding left-sided pneumothorax requiring chest tube placement. Electronically Signed   By: Simonne Come M.D.   On: 08/01/2016 14:48   Dg Chest Port 1 View  Result Date: 08/02/2016 CLINICAL DATA:  Chest tube removal. EXAM: PORTABLE CHEST 1 VIEW 2:24 p.m. COMPARISON:  08/02/2016 at 11:44 a.m. FINDINGS: The small left chest tube has been removed. No residual pneumothorax. There is slight atelectasis at the left lung base. The small mass in the left lower lobe is faintly visible. Right lung is clear.  Heart size and vascularity are normal. Prior CABG.  Aortic atherosclerosis. IMPRESSION: No pneumothorax after chest tube removal. Minimal residual atelectasis at the left lung base. No change in the left lung mass. Aortic atherosclerosis. Electronically Signed   By: Francene Boyers M.D.   On: 08/02/2016 14:40   Dg Chest Port 1 View  Result Date: 08/02/2016 CLINICAL DATA:  Pneumothorax following biopsy. EXAM: PORTABLE CHEST 1 VIEW COMPARISON:  Chest radiograph - earlier same day ; 08/01/2016; chest CT - 08/01/2016 FINDINGS: Unchanged cardiac silhouette and mediastinal contours with atherosclerotic plaque within the thoracic aorta. Post median sternotomy and CABG. Slight retraction of left-sided  chest tube with radiopaque site marker now projected at the level of the left lateral chest wall. No recurrent pneumothorax. The lungs remain hyperexpanded. Unchanged trace left-sided effusion with associated left basilar opacities. Ill-defined left lower lung nodular heterogeneous opacities are unchanged. No new focal airspace opacities. No evidence of edema. No acute osseus abnormalities. IMPRESSION: 1. Slight retraction of left-sided chest tube without evidence of recurrent pneumothorax. 2. Grossly unchanged appearance of known ill-defined heterogeneous nodular airspace opacities within the left lower lung. No new focal airspace opacities. 3. Aortic Atherosclerosis (ICD10-I70.0) and Emphysema (ICD10-J43.9). Electronically Signed   By: Simonne Come M.D.   On: 08/02/2016 11:59   Dg Chest Port 1 View  Result Date: 08/02/2016 CLINICAL DATA:  Follow-up Chest tube placement following lung biopsy EXAM: PORTABLE CHEST 1 VIEW COMPARISON:  08/01/2016 FINDINGS: Cardiac shadow is mildly  enlarged but stable. Postsurgical changes are seen. Hyperinflation consistent with emphysema is noted. The known left lower lobe nodule is vaguely identified. A left basilar chest tube is seen. No pneumothorax is noted. No acute bony abnormality is seen. IMPRESSION: No evidence of pneumothorax. Chest tube in satisfactory position. Electronically Signed   By: Alcide Clever M.D.   On: 08/02/2016 07:18   Dg Chest Port 1 View  Result Date: 08/01/2016 CLINICAL DATA:  Status post lung biopsy. EXAM: PORTABLE CHEST 1 VIEW COMPARISON:  CT chest 08/01/2016 FINDINGS: Interval left lower lobe pulmonary nodule biopsy. Interval placement of a left basilar chest tube in satisfactory position. No residual pneumothorax. Mild bilateral interstitial thickening. Left lower lobe pulmonary nodule is not well visualized. No pleural effusion or pneumothorax. Heart and mediastinal contours are unremarkable. Prior CABG. No acute osseous abnormality. IMPRESSION:  Interval placement of a left basilar chest tube. No residual pneumothorax. Electronically Signed   By: Elige Ko   On: 08/01/2016 10:53   Ct Image Guided Drainage By Percutaneous Catheter  Result Date: 08/01/2016 INDICATION: No known primary, now with waxing and waning left sided pulmonary nodules. Please perform CT-guided biopsy of macrolobulated dominant nodule within the left lower lobe for tissue diagnostic purposes. EXAM: 1. CT-GUIDED LEFT UPPER LOBE PULMONARY NODULE BIOPSY 2. CT-GUIDED LEFT-SIDED CHEST TUBE PLACEMENT COMPARISON:  Chest CT - 05/10/2016; PET-CT - 04/12/2016 MEDICATIONS: None. ANESTHESIA/SEDATION: Fentanyl 100 mcg IV; Versed 1 mg IV Sedation time: 38 minutes; The patient was continuously monitored during the procedure by the interventional radiology nurse under my direct supervision. CONTRAST:  None COMPLICATIONS: SIR LEVEL C - Requires therapy, minor hospitalization (<48 hrs). Procedure complicated by development of an enlarging pneumothorax requiring chest tube placement. Fortunately, patient remained hemodynamically stable throughout the entirety of the procedure and chest tube placement. PROCEDURE: Informed consent was obtained from the patient following an explanation of the procedure, risks, benefits and alternatives. The patient understands,agrees and consents for the procedure. All questions were addressed. A time out was performed prior to the initiation of the procedure. The patient was positioned supine on the CT table and a limited chest CT was performed for procedural planning demonstrating a macrolobulated approximately 2.3 x 0.9 cm nodule within the peripheral aspect of the left upper lobe (image 26, series 2). The operative site was prepped and draped in the usual sterile fashion. Under sterile conditions and local anesthesia, a 17 gauge coaxial needle was advanced into the peripheral aspect of the nodule. Positioning was confirmed with intermittent CT fluoroscopy and  followed by the acquisition of 2 core needle biopsies with an 18 gauge core needle biopsy device. The coaxial needle was removed following deployment of a Biosentry plug and superficial hemostasis was achieved with manual compression. Postprocedural imaging demonstrated development of a small left-sided pneumothorax. The patient was observed for 10 minutes on the CT gantry, however delayed imaging demonstrated interval expansion of the left-sided pneumothorax and as such, the decision was made to place a chest tube. As such, a location along the left inferior anterolateral chest wall was anesthetized with 1% lidocaine. A 22 gauge spinal needle was utilized for procedural planning purposes. An 18 gauge trocar needle was advanced and a short Amplatz wire was coiled within the left pleural space. Appropriate position was confirmed with CT imaging. The track was dilated ultimately allowing placement of a 10 French percutaneous drainage catheter. Postprocedural imaging demonstrates appropriate positioning of the chest tube with end coiled and locked within the anterior inferior aspect the left pleural  space. The chest tube was connected to a pleural vac device, secured in place with an interrupted suture and a Stat Lock device. A dressing was placed. The patient otherwise tolerated the procedure well without immediate postprocedural complication and remained hemodynamically stable throughout the procedure. The patient was escorted to have an upright chest radiograph. IMPRESSION: 1. Technically successful CT guided core needle core biopsy of indeterminate nodule within the left upper lobe. 2. Procedure complicated by development of an expanding left-sided pneumothorax requiring chest tube placement. Electronically Signed   By: Simonne ComeJohn  Watts M.D.   On: 08/01/2016 14:48    Microbiology: Recent Results (from the past 240 hour(s))  Aerobic/Anaerobic Culture (surgical/deep wound)     Status: None (Preliminary result)    Collection Time: 08/01/16  9:42 AM  Result Value Ref Range Status   Specimen Description LUNG LEFT  Final   Special Requests   Final    LUNG LEFT LOWER LOBE WAXING AND WANING LEFT SIDED PULMONARY NODULES POST CT GUIDED BX   Gram Stain   Final    FEW WBC PRESENT, PREDOMINANTLY MONONUCLEAR NO ORGANISMS SEEN    Culture NO GROWTH 1 DAY  Final   Report Status PENDING  Incomplete  MRSA PCR Screening     Status: Abnormal   Collection Time: 08/02/16 12:20 PM  Result Value Ref Range Status   MRSA by PCR POSITIVE (A) NEGATIVE Final    Comment:        The GeneXpert MRSA Assay (FDA approved for NASAL specimens only), is one component of a comprehensive MRSA colonization surveillance program. It is not intended to diagnose MRSA infection nor to guide or monitor treatment for MRSA infections. RESULT CALLED TO, READ BACK BY AND VERIFIED WITH: Durenda AgeN. [place] RN 14:40 08/02/16 ([email or web address])      Labs: Basic Metabolic Panel:  Recent Labs Lab 08/01/16 0615 08/02/16 0503  NA 142 138  K 4.1 4.3  CL 107 104  CO2 31 29  GLUCOSE 95 111*  BUN 11 9  CREATININE 0.79 0.86  CALCIUM 8.8* 8.3*  MG 2.2  --   PHOS 4.4  --    Liver Function Tests: No results for input(s): AST, ALT, ALKPHOS, BILITOT, PROT, ALBUMIN in the last 168 hours. No results for input(s): LIPASE, AMYLASE in the last 168 hours. No results for input(s): AMMONIA in the last 168 hours. CBC:  Recent Labs Lab 08/01/16 0600 08/02/16 0503  WBC 5.3 4.2  HGB 13.4 11.7*  HCT 41.7 37.0  MCV 92.3 93.2  PLT 205 159   Cardiac Enzymes: No results for input(s): CKTOTAL, CKMB, CKMBINDEX, TROPONINI in the last 168 hours. BNP: BNP (last 3 results) No results for input(s): BNP in the last 8760 hours.  ProBNP (last 3 results) No results for input(s): PROBNP in the last 8760 hours.  CBG: No results for input(s): GLUCAP in the last 168 hours.     Signed:  Carolyne Littlesurtis Woods, MD [facility] [phone] pager

## 2016-08-03 NOTE — Care Management Note (Signed)
Case Management Note  Patient Details  Name: Casey Moran MRN: [id number] Date of Birth: 07/13/1940  Subjective/Objective:                 Patient with order to DC to home, No CM needs identified.    Action/Plan:  DC to home self care.  Expected Discharge Date:  08/03/16               Expected Discharge Plan:  Home/Self Care  In-House Referral:     Discharge planning Services  CM Consult  Post Acute Care Choice:    Choice offered to:     DME Arranged:    DME Agency:     HH Arranged:    HH Agency:     Status of Service:  Completed, signed off  If discussed at [employer] Length of Stay Meetings, dates discussed:    Additional Comments:  Lawerance SabalDebbie Naleah Kofoed, RN 08/03/2016, 10:52 AM

## 2016-08-03 NOTE — Progress Notes (Signed)
o2 at rest: 99% on room air 02 on ambulation: 95% on room air No shortness of breath noted.

## 2016-08-06 LAB — AEROBIC/ANAEROBIC CULTURE (SURGICAL/DEEP WOUND)

## 2016-08-06 LAB — AEROBIC/ANAEROBIC CULTURE W GRAM STAIN (SURGICAL/DEEP WOUND): Culture: NO GROWTH

## 2016-08-15 ENCOUNTER — Ambulatory Visit (INDEPENDENT_AMBULATORY_CARE_PROVIDER_SITE_OTHER): Payer: Medicare Other | Admitting: Cardiothoracic Surgery

## 2016-08-15 ENCOUNTER — Ambulatory Visit
Admission: RE | Admit: 2016-08-15 | Discharge: 2016-08-15 | Disposition: A | Discharge status: Home / Self Care | Payer: Medicare Other | Source: Ambulatory Visit | Attending: Cardiothoracic Surgery | Admitting: Cardiothoracic Surgery

## 2016-08-15 ENCOUNTER — Other Ambulatory Visit: Payer: Self-pay | Admitting: Cardiothoracic Surgery

## 2016-08-15 ENCOUNTER — Encounter: Payer: Self-pay | Admitting: Cardiothoracic Surgery

## 2016-08-15 VITALS — BP 117/68 | HR 77 | Resp 20 | Ht 66.0 in | Wt 124.0 lb

## 2016-08-15 DIAGNOSIS — J939 Pneumothorax, unspecified: Secondary | ICD-10-CM

## 2016-08-15 DIAGNOSIS — J984 Other disorders of lung: Secondary | ICD-10-CM

## 2016-08-15 DIAGNOSIS — R918 Other nonspecific abnormal finding of lung field: Secondary | ICD-10-CM

## 2016-08-15 NOTE — Progress Notes (Signed)
[address]       [place] [address]             [phone]                    Casey Moran [facility] Medical Record #[id number] Date of Birth: 1940-03-22  Referring: Casey Dicker, MD Primary Care: Casey Miles., MD  Chief Complaint:    Chief Complaint  Patient presents with  . Lung Mass    3 month f/u with Chest CT @ [facility] 07/11/16, CT BX 08/01/16 and CXR today  . Lung Lesion    History of Present Illness:    Casey Moran 76 y.o. female following office for evaluation of left upper lobe lung lesion.   Patient had previously had a lung cancer screening CT done at [facility] 10/20/2014, reported at that time was negative lung cancer screening CT evidence of emphysema. 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 Pricilla Holm in 2000. She was being evaluated for tinnitus and unsteadiness on her feet. A chest x-ray demonstrated questionable left lung mass a CT scan done February 26 03/26/2016 at [facility] showed a new irregular parenchymal opacity left upper lobe measuring 3.7 x 2.1 x 1.8 cm. A PET scan was performed and the patient referred to thoracic surgery for evaluation.  The patient was previously seen it was noted that the left upper lobe lung lesion had significantly decreased in size between the original CT scan and the PET scan. A follow-up CT scan now 2 months after the original .  Before the patient returned to the office for the repeat CT scan one was done at [facility] that showed some increase in size of the left lung mass. Patient was set up for needle biopsy of the left lung mass since it had increased, a CT-guided needle biopsy was performed resulting in pneumothorax admission with chest tube for 48 hours.   Diagnosis Lung, needle/core biopsy(ies), Left Lower Lobe - GRANULOMATOUS INFLAMMATION WITH FOCAL NECROSIS. - THERE IS NO  EVIDENCE OF MALIGNANCY. - SEE COMMENT. Microscopic Comment AFB, GMS, and PAS stains are negative for presence of microorganisms. (JB[facility]:ah 08/02/16) [name] MD Pathologist, Electronic Signat  Cultures at the time of needle biopsy were no growth.  Current Activity/ Functional Status:  Patient is independent with mobility/ambulation, transfers, ADL's, IADL's.   Zubrod Score: At the time of surgery this patient's most appropriate activity status/level should be described as: []     0    Normal activity, no symptoms [x]     1    Restricted in physical strenuous activity but ambulatory, able to do out light work []     2    Ambulatory and capable of self care, unable to do work activities, up and about               >50 % of waking hours                              []     3    Only limited self care, in bed greater than 50% of waking hours []     4    Completely disabled, no self care, confined to bed or chair []     5    Moribund   Past Medical History:  Diagnosis Date  . Abscess of finger of left hand   . Anxiety   . ASCVD (arteriosclerotic cardiovascular disease)   . CAD (coronary artery disease)   . COPD (chronic obstructive pulmonary disease) (HCC)   . Depression   . Family history of hereditary hemorrhagic telangiectasia (HHT)   . GERD (gastroesophageal reflux disease)   . Hematuria, microscopic   . Hyperlipemia   . Hypertension   . Hypokalemia   . Hypothyroidism   . Lung mass   . Pneumothorax after biopsy 08/01/2016  . Pulmonary hypertension (HCC)     Past Surgical History:  Procedure Laterality Date  . ABDOMINAL HYSTERECTOMY    . CORONARY ARTERY BYPASS GRAFT     [facility]/Dr Arlyss Queen   . DG BIOPSY LUNG Left 08/01/2016    No family history on file.  Social History   Social History  . Marital status: Widowed    Spouse name: N/A  . Number of children: N/A  . Years of education: N/A   Occupational History  . Not on file.   Social History Main  Topics  . Smoking status: Current Every Day Smoker    Types: Cigarettes  . Smokeless tobacco: Never Used  . Alcohol use No  . Drug use: No  . Sexual activity: No   Other Topics Concern  . Not on file   Social History Narrative  . No narrative on file    History  Smoking Status  . Current Every Day Smoker  . Types: Cigarettes  Smokeless Tobacco  . Never Used    History  Alcohol Use No     Allergies  Allergen Reactions  . Bupropion Tinitus  . Opium Palpitations and Other (See Comments)    Increased heart rate  . Azithromycin Rash  . Ciprofloxacin Other (See Comments)    Bruising  . Morphine And Related Nausea And Vomiting    Pt states morphine makes her vomit  But hydrocodone and oxycodone are ok.   . Adhesive [Tape] Other (See Comments)    Causes blisters. From electrode adhesive.  Marland Kitchen Penicillins   . Sulfa Antibiotics   . Amoxicillin Rash  . Clindamycin/Lincomycin Rash  . Codeine Phosphate [Codeine] Rash  . Penicillin G Rash  . Sulfamethoxazole Rash    Current Outpatient Prescriptions  Medication Sig Dispense Refill  . acetaminophen (TYLENOL) 500 MG tablet Take 500-1,000 mg by mouth every 6 (six) hours as needed for mild pain or moderate pain.     Marland Kitchen albuterol (PROVENTIL HFA;VENTOLIN HFA) 108 (90 Base) MCG/ACT inhaler Inhale 2 puffs into the lungs every 4 (four) hours as needed for wheezing or shortness of breath.     . ALPRAZolam (XANAX) 1 MG tablet Take 1 mg by mouth at bedtime.     Marland Kitchen aspirin EC 81 MG tablet Take 81 mg by mouth at bedtime.     Marland Kitchen atorvastatin (LIPITOR) 80 MG tablet Take 80 mg by mouth at bedtime.     . budesonide-formoterol (SYMBICORT) 160-4.5 MCG/ACT inhaler Inhale 2 puffs into the lungs 2 (two) times daily.     . Calcium-Phosphorus-Vitamin D (CALCIUM/D3 ADULT GUMMIES PO) Take 2 each by mouth daily.    . clopidogrel (PLAVIX) 75 MG tablet TAKE 1 TABLET BY MOUTH DAILY    . ferrous sulfate 325 (65 FE) MG EC tablet Take 325 mg by mouth at bedtime.     Marland Kitchen FIBER ADULT GUMMIES PO Take 2 each by mouth at bedtime.    Marland Kitchen FLUoxetine (PROZAC) 40 MG  capsule Take 80 mg by mouth every morning.     Marland Kitchen levothyroxine (SYNTHROID, LEVOTHROID) 88 MCG tablet TAKE 1 TABLET EVERY DAY  AT  6AM    . lisinopril (PRINIVIL,ZESTRIL) 10 MG tablet Take 10 mg by mouth every morning.    . metoprolol succinate (TOPROL-XL) 50 MG 24 hr tablet Take 50 mg by mouth every morning.     . nicotine (NICODERM CQ - DOSED IN MG/24 HOURS) 21 mg/24hr patch Place 1 patch (21 mg total) onto the skin daily. 28 patch 0  . nitroGLYCERIN (NITROSTAT) 0.4 MG SL tablet PLACE ONE TABLET UNDER THE TONGUE EVERY FIVE MINUTES AS NEEDED FOR CHEST PAIN AS DIRECTED    . omeprazole (PRILOSEC) 40 MG capsule Take 40 mg by mouth every morning.     Marland Kitchen oxyCODONE (OXY IR/ROXICODONE) 5 MG immediate release tablet Take 1 tablet (5 mg total) by mouth every 4 (four) hours as needed for moderate pain. 10 tablet 0  . pantoprazole (PROTONIX) 40 MG tablet Take 1 tablet (40 mg total) by mouth daily. 30 tablet 0  . sennosides-docusate sodium (SENOKOT-S) 8.6-50 MG tablet Take 1 tablet by mouth at bedtime.    . Tiotropium Bromide Monohydrate (SPIRIVA RESPIMAT) 2.5 MCG/ACT AERS Inhale 2 puffs into the lungs daily.      No current facility-administered medications for this visit.       Review of Systems:     Cardiac Review of Systems: Y or N  Chest Pain [  n  ]  Resting SOB [ n  ] Exertional SOB  [ y ]  Orthopnea [ y ]   Pedal Edema [  n ]    Palpitations [n  ] Syncope  [n  ]   Presyncope [n   ]  General Review of Systems: [Y] = yes [  ]=no Constitional: recent weight change [  ];  Wt loss over the last 3 months [   ] anorexia [  ]; fatigue [  ]; nausea [  ]; night sweats [n  ]; fever [  ]; or chills [ n ];          Dental: poor dentition[  ]; Last Dentist visit:   Eye : blurred vision [  ]; diplopia [   ]; vision changes [  ];  Amaurosis fugax[  ]; Resp: cough [  ];  wheezing[ y ];  hemoptysis[n  ]; shortness of  breath[y  ]; paroxysmal nocturnal dyspnea[ y ]; dyspnea on exertion[ y ]; or orthopnea[  ];  GI:  gallstones[  ], vomiting[  ];  dysphagia[  ]; melena[  ];  hematochezia [  ]; heartburn[  ];   Hx of  Colonoscopy[  ]; GU: kidney stones [  ]; hematuria[  ];   dysuria [  ];  nocturia[  ];  history of     obstruction [  ]; urinary frequency [  ]             Skin: rash, swelling[  ];, hair loss[  ];  peripheral edema[  ];  or itching[  ]; Musculosketetal: myalgias[  ];  joint swelling[  ];  joint erythema[  ];  joint pain[  ];  back pain[  ];  Heme/Lymph: bruising[  ];  bleeding[  ];  anemia[  ];  Neuro: TIA[  ];  headaches[  ];  stroke[  ];  vertigo[  ];  seizures[  ];   paresthesias[  ];  difficulty walking[  ];  Psych:depression[  ]; anxiety[  ];  Endocrine: diabetes[n  ];  thyroid dysfunction[  ];  Immunizations: Flu up to date [place].Etienne ]; Pneumococcal up to date [ y ];  Other:  Physical Exam: BP 117/68   Pulse 77   Resp 20   Ht 5\' 6"  (1.676 m)   Wt 124 lb (56.2 kg)   SpO2 94% Comment: RA  BMI 20.01 kg/m   PHYSICAL EXAMINATION: General appearance: alert, cooperative and appears older than stated age Head: Normocephalic, without obvious abnormality, atraumatic Neck: no adenopathy, no carotid bruit, no JVD, supple, symmetrical, trachea midline and thyroid not enlarged, symmetric, no tenderness/mass/nodules Lymph nodes: Cervical, supraclavicular, and axillary nodes normal. Resp: clear to auscultation bilaterally Back: symmetric, no curvature. ROM normal. No CVA tenderness. Cardio: regular rate and rhythm, S1, S2 normal, no murmur, click, rub or gallop GI: soft, non-tender; bowel sounds normal; no masses,  no organomegaly Extremities: extremities normal, atraumatic, no cyanosis or edema Neurologic: Grossly normal  Diagnostic Studies & Laboratory data:     Recent Radiology Findings:   Dg Chest 2 View  Result Date: 08/15/2016 CLINICAL DATA:  History of pneumothorax eft reason lung biopsy.  Shortness of breath. EXAM: CHEST  2 VIEW COMPARISON:  08/02/2016 FINDINGS: Residual atelectasis or scarring at the left base. No pneumothorax. Mild hyperinflation. Prior CABG. Heart is normal size. No effusions or acute bony abnormality. IMPRESSION: Residual left base scarring or atelectasis.  No pneumothorax. COPD. Electronically Signed   By: Charlett Nose M.D.   On: 08/15/2016 12:30   Ct Biopsy  Result Date: 08/01/2016 INDICATION: No known primary, now with waxing and waning left sided pulmonary nodules. Please perform CT-guided biopsy of macrolobulated dominant nodule within the left lower lobe for tissue diagnostic purposes. EXAM: 1. CT-GUIDED LEFT UPPER LOBE PULMONARY NODULE BIOPSY 2. CT-GUIDED LEFT-SIDED CHEST TUBE PLACEMENT COMPARISON:  Chest CT - 05/10/2016; PET-CT - 04/12/2016 MEDICATIONS: None. ANESTHESIA/SEDATION: Fentanyl 100 mcg IV; Versed 1 mg IV Sedation time: 38 minutes; The patient was continuously monitored during the procedure by the interventional radiology nurse under my direct supervision. CONTRAST:  None COMPLICATIONS: SIR LEVEL C - Requires therapy, minor hospitalization (<48 hrs). Procedure complicated by development of an enlarging pneumothorax requiring chest tube placement. Fortunately, patient remained hemodynamically stable throughout the entirety of the procedure and chest tube placement. PROCEDURE: Informed consent was obtained from the patient following an explanation of the procedure, risks, benefits and alternatives. The patient understands,agrees and consents for the procedure. All questions were addressed. A time out was performed prior to the initiation of the procedure. The patient was positioned supine on the CT table and a limited chest CT was performed for procedural planning demonstrating a macrolobulated approximately 2.3 x 0.9 cm nodule within the peripheral aspect of the left upper lobe (image 26, series 2). The operative site was prepped and draped in the usual  sterile fashion. Under sterile conditions and local anesthesia, a 17 gauge coaxial needle was advanced into the peripheral aspect of the nodule. Positioning was confirmed with intermittent CT fluoroscopy and followed by the acquisition of 2 core needle biopsies with an 18 gauge core needle biopsy device. The coaxial needle was removed following deployment of a Biosentry plug and superficial hemostasis was achieved with manual compression. Postprocedural imaging demonstrated development of a small left-sided pneumothorax. The patient was observed for 10 minutes on the CT gantry, however delayed imaging demonstrated interval expansion of the left-sided pneumothorax and as such, the decision was made to place a chest tube. As  such, a location along the left inferior anterolateral chest wall was anesthetized with 1% lidocaine. A 22 gauge spinal needle was utilized for procedural planning purposes. An 18 gauge trocar needle was advanced and a short Amplatz wire was coiled within the left pleural space. Appropriate position was confirmed with CT imaging. The track was dilated ultimately allowing placement of a 10 French percutaneous drainage catheter. Postprocedural imaging demonstrates appropriate positioning of the chest tube with end coiled and locked within the anterior inferior aspect the left pleural space. The chest tube was connected to a pleural vac device, secured in place with an interrupted suture and a Stat Lock device. A dressing was placed. The patient otherwise tolerated the procedure well without immediate postprocedural complication and remained hemodynamically stable throughout the procedure. The patient was escorted to have an upright chest radiograph. IMPRESSION: 1. Technically successful CT guided core needle core biopsy of indeterminate nodule within the left upper lobe. 2. Procedure complicated by development of an expanding left-sided pneumothorax requiring chest tube placement. Electronically  Signed   By: Simonne Come M.D.   On: 08/01/2016 14:48   Ct Super D Chest Wo Contrast  Result Date: 05/10/2016 CLINICAL DATA:  Preoperative evaluation for lung mass. EXAM: CT CHEST WITHOUT CONTRAST TECHNIQUE: Multidetector CT imaging of the chest was performed using thin slice collimation for electromagnetic bronchoscopy planning purposes, without intravenous contrast. COMPARISON:  PET-CT 04/12/2016. By report, the patient had a chest CT on 03/26/2016, but this study is not available for review. FINDINGS: Cardiovascular: The heart size is normal. No pericardial effusion. Patient is status post CABG. Atherosclerotic calcification is noted in the wall of the thoracic aorta. Mediastinum/Nodes: Scattered small mediastinal lymph nodes. No mediastinal lymphadenopathy. No evidence for gross hilar lymphadenopathy although assessment is limited by the lack of intravenous contrast on today's study. The esophagus has normal imaging features. There is no axillary lymphadenopathy. Lungs/Pleura: The anterior left upper lobe pulmonary nodule seen on the PET-CT at 1.5 x 0.7 cm has become much less confluent in the interval. The area now appears more in keeping with scarring/architectural distortion than a discrete nodule. Reports in EPIC suggest the nodule have decreased in size between 03/26/2016 and 04/12/2016 and findings on today's study would suggest further continued resolution. Centrilobular emphysema noted. Interval development of a 1.0 x 1.9 cm left lower lobe pulmonary nodule (image 84 series 5) with some adjacent tiny pulmonary nodules. Scarring the inferior lingula is stable. 3 mm posterior right lower lobe pulmonary nodule (image 89 series 5) was not definitely seen on the previous study. No pulmonary edema or pleural effusion. Upper Abdomen: 13 mm low-density lesion lateral segment left liver was present on a study from 11/03/2011 Musculoskeletal: Bone windows reveal no worrisome lytic or sclerotic osseous lesions.  IMPRESSION: 1. Anterior left upper lobe pulmonary nodule in question has become less confluent in the interval and today is more suggestive of architectural distortion/scarring than a discrete nodule. Clinic notes indicate that the nodule decreased in size between a study of 03/26/2016 (unavailable today) and the PET-CT of 04/12/2016. Continued further resolution today would be much more suggestive of an infectious/ inflammatory etiology than neoplasm. 2. Interval development of a 1.0 x 1.9 cm left lower lobe pulmonary nodule with adjacent tiny satellite nodules. The rapid interval appearance of this nodule along with a satellite nodules is more suggestive of infection than neoplasm. Follow-up could be used to assess for resolution. 3. 3 mm posterior right lower lobe pulmonary nodule could also be reassessed at the  time of followup. 4. Emphysema. Electronically Signed   By: Kennith Center M.D.   On: 05/10/2016 14:45       1. Malignant range FDG uptake is associated with the 1.5 x 0.7 cm indeterminate nodular density within the left upper lobe. In this patient who is at increased risk for bronchogenic carcinoma cannot rule out primary pulmonary neoplasm. Advise correlation with recent screening chest CT and tissue sampling. 2. Subcentimeter prevascular lymph node exhibits low level, nonspecific increased FDG uptake. 3. Aortic atherosclerosis and coronary artery calcification status post CABG procedure. 4. Emphysema.   Electronically Signed ByMinette Brine M.D. On: 04/12/2016 10:37  Result Narrative  CLINICAL DATA:Initial treatment strategy for lung mass.  EXAM: NUCLEAR MEDICINE PET SKULL BASE TO THIGH  TECHNIQUE: 18.0 mCi F-18 FDG was injected intravenously. Full-ring PET imaging was performed from the skull base to thigh after the radiotracer. CT data was obtained and used for attenuation correction and anatomic localization.  FASTING BLOOD GLUCOSE:Value: 91  mg/dl  COMPARISON:CT chest from 11/03/11  FINDINGS: NECK  No hypermetabolic lymph nodes in the neck.  CHEST  Moderate changes of centrilobular emphysema. Indeterminate nodular density within the left upper lobe measures 1.5 x 0.7 cm, image 80 of series 3. There is a malignant range FDG uptake on the corresponding PET images within SUV max equal to 2.38.  Moderate cardiac enlargement. There is aortic atherosclerosis and coronary artery calcifications. Previous CABG procedure. There is a pre-vascular lymph node adjacent to the descending thoracic aorta which measures 8 mm and has an SUV max equal to 2.04.  ABDOMEN/PELVIS  No abnormal hypermetabolic activity within the liver, pancreas, adrenal glands, or spleen. No hypermetabolic lymph nodes in the abdomen or pelvis.  SKELETON  No focal hypermetabolic activity to suggest skeletal metastasis.    Cardiology workup  No significant ST segment changes or arrhythmias were noted during  stress  1. Unremarkable baseline EKG. 2. Well-tolerated Lexiscan cardiac stress administration. 3. No EKG changes suggestive of ischemia. 4. Nuclear perfusion image results will be interpreted by Radiology and  will appear in a separate report.  Tollie Pizza, DO [facility] Interventional Cardiology and Peripheral Vascular Interventions [facility] 1/9/201811:52 AM  1. No reversible ischemia or infarction.  2. Normal left ventricular wall motion.  3. Left ventricular ejection fraction is 68%.  4. Non invasive risk stratification*: Low  *2012 Appropriate Use Criteria for Coronary Revascularization Focused Update: J Am Coll Cardiol. 2012;59(9):857-881. http://content.dementiazones.com.aspx?articleid=1201161   Electronically Signed ByGlean Hess M.D. On: 02/13/2016 15:43   This study shows minimal small vessel ischemic change and mild cortical atrophy possibly within normal limits at this patient's age. 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 There is no abnormal enhancement.  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  FINDINGS: Calvarium/skull base: No focal marrow replacing lesion suggestive of neoplasm. Orbits: Grossly unremarkable. Paranasal sinuses: Imaged portions clear. There is however extensive increased T2 signal involving both the right and left mastoid. Brain: The cerebellum shows no abnormal signal change. The fourth ventricle is of normal size with no displacement. The brainstem structures are well outlined showing  no abnormal signal. The 7th and 8th nerve complex and 5th cranial nerve are identified and unremarkable. Flow void in the vertebrobasilar and anterior circulation is identified and unremarkable. The third and lateral ventricles are of normal size with no displacement. There are several areas of hyperintense T2 signal seen within the white matter of either hemisphere. There is generalized cortical atrophy.  Following injection of contrast material no abnormal enhancement is identified.  Additional comments: None.   Cardiology workup  No significant ST segment changes or arrhythmias were noted during  stress  1. Unremarkable baseline EKG. 2. Well-tolerated Lexiscan cardiac stress administration. 3. No EKG changes suggestive of ischemia. 4. Nuclear perfusion image results will be interpreted by Radiology and  will appear in a separate report.  Tollie PizzaKurt Daniel, DO [facility] Interventional Cardiology and Peripheral Vascular  Interventions [facility] 1/9/201811:52 AM  1. No reversible ischemia or infarction.  2. Normal left ventricular wall motion.  3. Left ventricular ejection fraction is 68%.  4. Non invasive risk stratification*: Low  *2012 Appropriate Use Criteria for Coronary Revascularization Focused Update: J Am Coll Cardiol. 2012;59(9):857-881. http://content.dementiazones.comonlinejacc.org/article.aspx?articleid=1201161   Electronically Signed ByGlean Hess: AdamHenn M.D. On: 02/13/2016 15:43   Recent Lab Findings: Lab Results  Component Value Date   WBC 4.2 08/02/2016   HGB 11.7 (L) 08/02/2016   HCT 37.0 08/02/2016   PLT 159 08/02/2016   GLUCOSE 111 (H) 08/02/2016   NA 138 08/02/2016   K 4.3 08/02/2016   CL 104 08/02/2016   CREATININE 0.86 08/02/2016   BUN 9 08/02/2016   CO2 29 08/02/2016   INR 0.93 08/01/2016    Pulmonary Function Diagnosis: Mild Obstructive Airways Disease Insignificant response to bronchodilator Severe Diffusion Defect FEV1- 1.57 69% dlco 10.97 42%   Assessment / Plan:   1/ New left upper lobe lung mass, hypermetabolic- on review of the films between the original CT scan 04/01/2016 and the subsequent PET scan 04/12/2016, - now needle biopsy of this mass shows no malignancy, today special smears and cultures for infectious etiology have been negative. Patient has a follow-up appointment with Dr. [facility] tomorrow. I would suggest a follow-up CT scan in 3-6 months to reevaluate lesion.  2/Mild Obstructive Airways Disease 3/ Severe Diffusion Defect  4/ Known  cad S/P cabg  I have reviewed the CT scan with the patient and her daughter today, with a significant change in size in the left upper lobe lesion and the sudden development of a separate lesion, I recommended to her that we not proceed with any surgical resection or even biopsy at this time, but will need a close interval follow-up CT scan in 3-4 months.   She was again strongly encouraged to stop  smoking completely.   Delight OvensEdward B Zuri Lascala MD      [address] [place] [id number] Office [phone]   Beeper [id number]  08/15/2016 1:53 PM

## 2018-05-03 IMAGING — CT CT BIOPSY
3 of 8 series · 12 of 32 positions shown, 17 images · non-contrast
Comparison: Chest CT - 05/10/2016;

INDICATION: No known primary, now with waxing and waning left sided pulmonary
nodules. Please perform CT-guided biopsy of macrolobulated dominant
nodule within the left lower lobe for tissue diagnostic purposes.

EXAM:
1. CT-GUIDED LEFT UPPER LOBE PULMONARY NODULE BIOPSY
2. CT-GUIDED LEFT-SIDED CHEST TUBE PLACEMENT

[Series 2: i-spiral 5.0 b40f · axial · 0.70mm/px · z∈[+1201,+1313]mm · 5 of 50 slices shown (1 of 3)]
[im 9/50  soft-tissue]
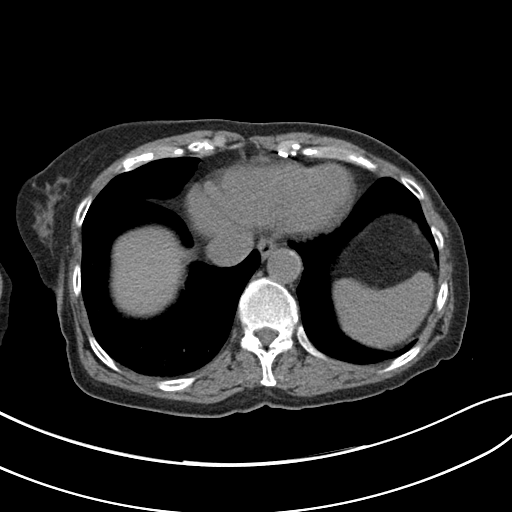
[im 17/50  soft-tissue]
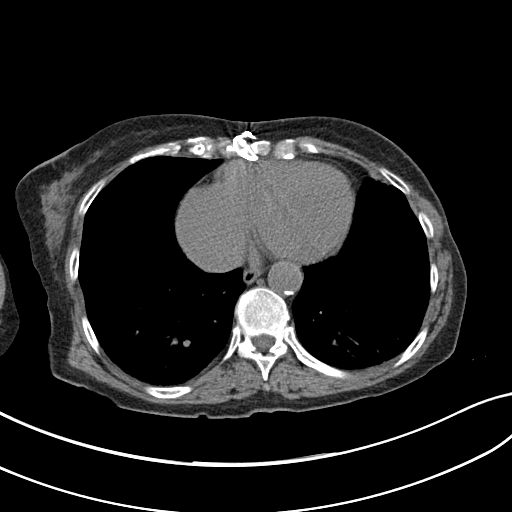
[im 25/50  soft-tissue]
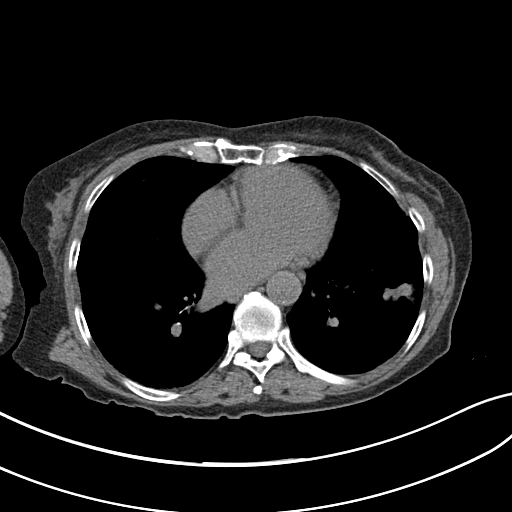
[im 33/50  soft-tissue]
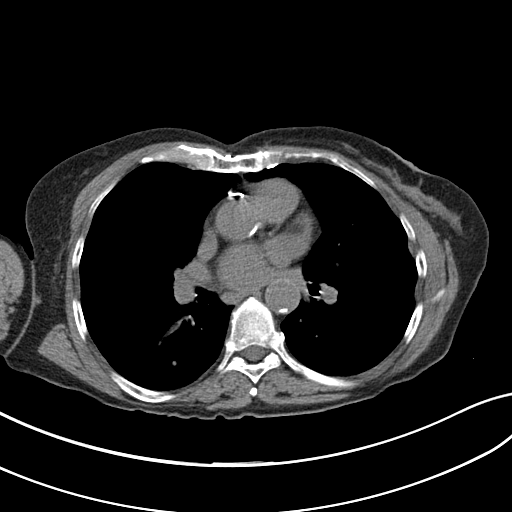
[im 41/50  soft-tissue]
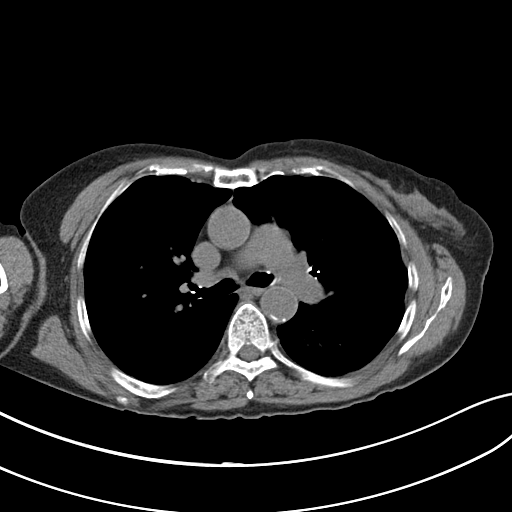

[Series 7: i-spiral 5.0 b40f · axial · 0.70mm/px · z∈[+1165,+1305]mm · 5 of 60 slices shown, 10 images (2 of 3)]
[im 10/60  soft-tissue]
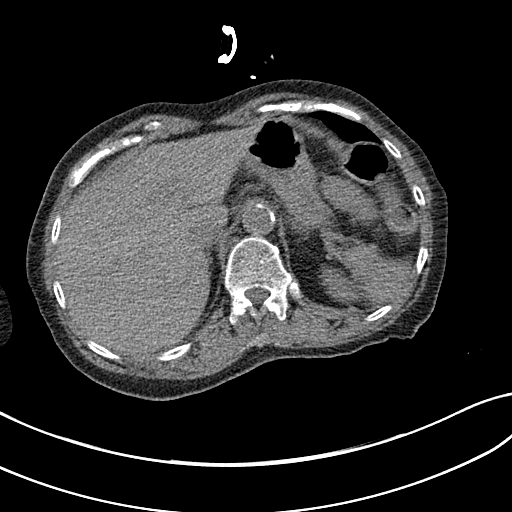
[im 10/60  bone]
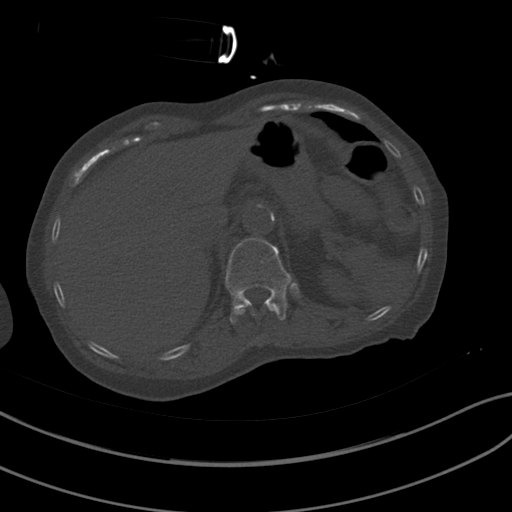
[im 20/60  soft-tissue]
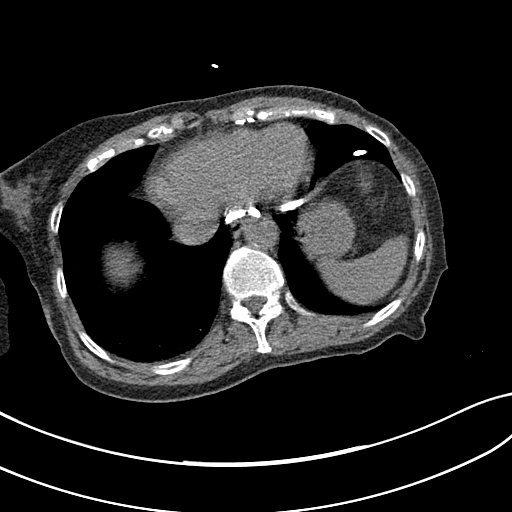
[im 20/60  lung]
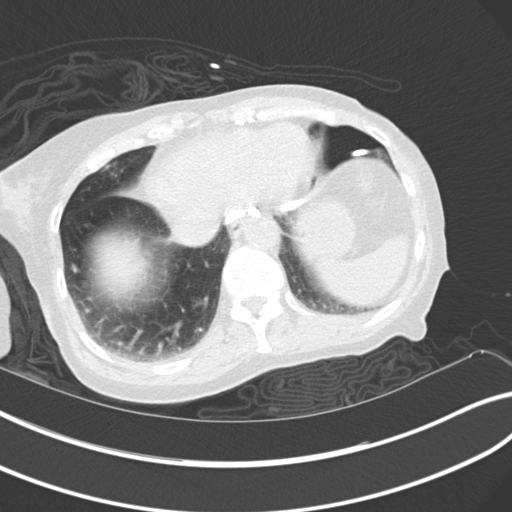
[im 30/60  soft-tissue]
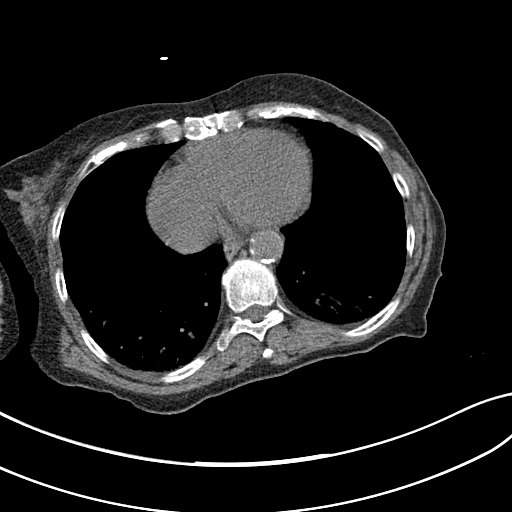
[im 30/60  lung]
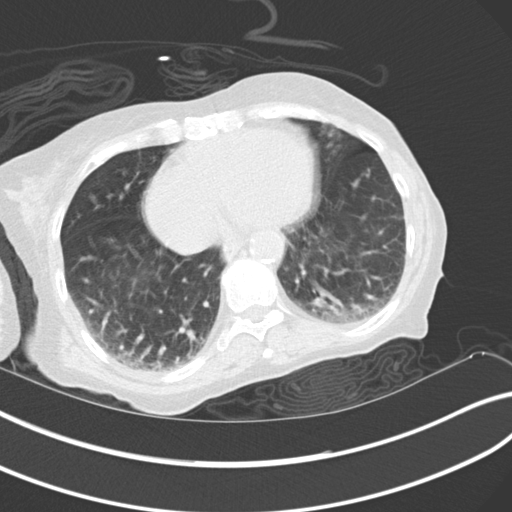
[im 40/60  soft-tissue]
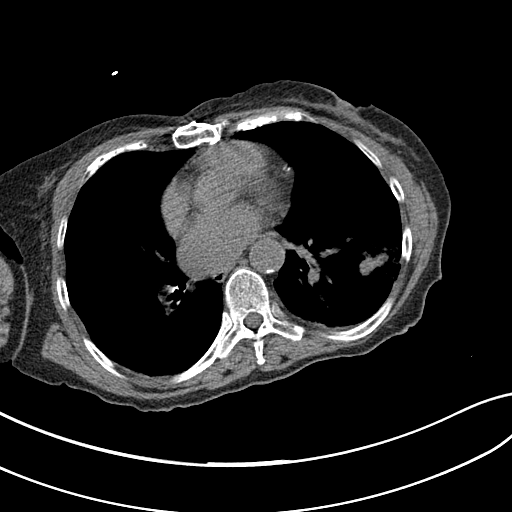
[im 40/60  lung]
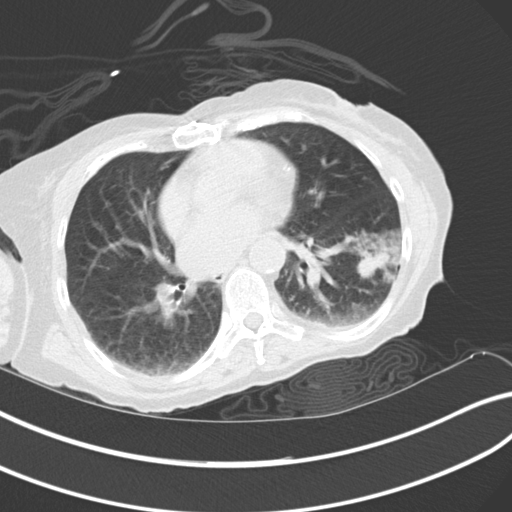
[im 50/60  soft-tissue]
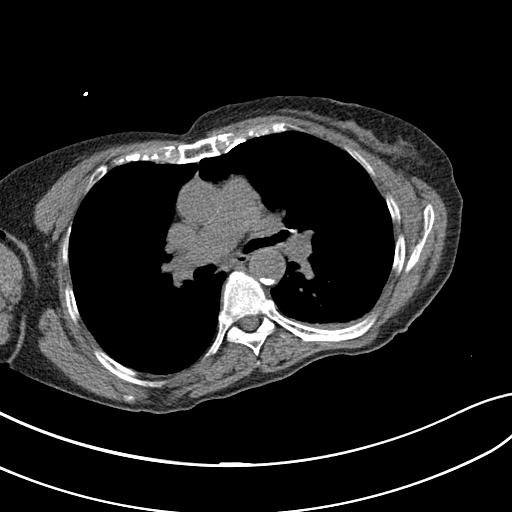
[im 50/60  lung]
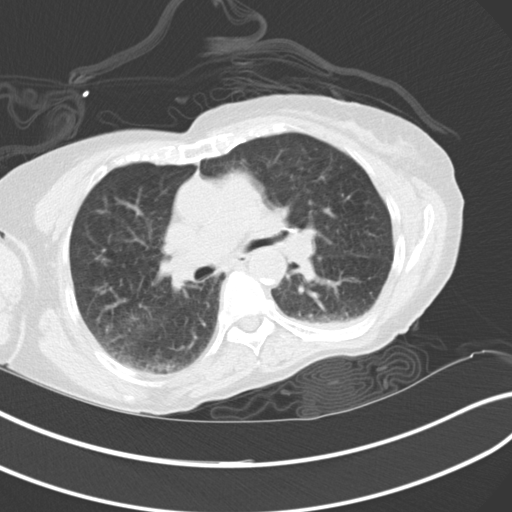

[Series 8: i-spiral 5.0 b40f · axial · 0.70mm/px · z∈[+1176,+1221]mm · 2 of 39 slices shown (3 of 3)]
[im 13/39  soft-tissue]
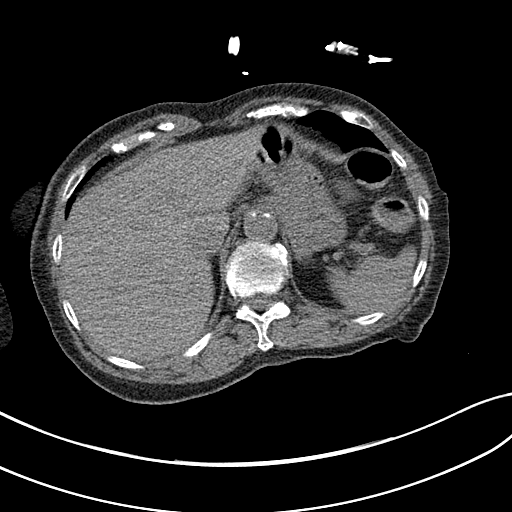
[im 26/39  soft-tissue]
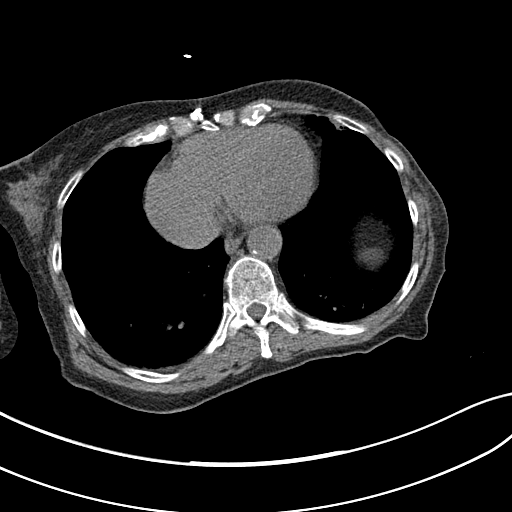

[12 of 32 positions shown; findings below may reference images not displayed]

PET-CT - 04/12/2016

MEDICATIONS:
None.

ANESTHESIA/SEDATION:
Fentanyl 100 mcg IV; Versed 1 mg IV

Sedation time: 38 minutes; The patient was continuously monitored
during the procedure by the interventional radiology nurse under my
direct supervision.

CONTRAST:  None

COMPLICATIONS:
SIR LEVEL C - Requires therapy, minor hospitalization (<48 hrs).

Procedure complicated by development of an enlarging pneumothorax
requiring chest tube placement. Fortunately, patient remained
hemodynamically stable throughout the entirety of the procedure and
chest tube placement.

PROCEDURE:
Informed consent was obtained from the patient following an
explanation of the procedure, risks, benefits and alternatives. The
patient understands,agrees and consents for the procedure. All
questions were addressed. A time out was performed prior to the
initiation of the procedure.

The patient was positioned supine on the CT table and a limited
chest CT was performed for procedural planning demonstrating a
macrolobulated approximately 2.3 x 0.9 cm nodule within the
peripheral aspect of the left upper lobe (image 26, series 2). The
operative site was prepped and draped in the usual sterile fashion.
Under sterile conditions and local anesthesia, a 17 gauge coaxial
needle was advanced into the peripheral aspect of the nodule.
Positioning was confirmed with intermittent CT fluoroscopy and
followed by the acquisition of 2 core needle biopsies with an 18
gauge core needle biopsy device. The coaxial needle was removed
following deployment of a [employer] plug and superficial hemostasis
was achieved with manual compression.

Postprocedural imaging demonstrated development of a small
left-sided pneumothorax. The patient was observed for 10 minutes on
the CT gantry, however delayed imaging demonstrated interval
expansion of the left-sided pneumothorax and as such, the decision
was made to place a chest tube.

As such, a location along the left inferior anterolateral chest wall
was anesthetized with 1% lidocaine. A 22 gauge spinal needle was
utilized for procedural planning purposes. An 18 gauge trocar needle
was advanced and a short Amplatz wire was coiled within the left
pleural space. Appropriate position was confirmed with CT imaging.
The track was dilated ultimately allowing placement of a 10 French
percutaneous drainage catheter. Postprocedural imaging demonstrates
appropriate positioning of the chest tube with end coiled and locked
within the anterior inferior aspect the left pleural space.

The chest tube was connected to a pleural vac device, secured in
place with an interrupted suture and a Stat Lock device. A dressing
was placed.

The patient otherwise tolerated the procedure well without immediate
postprocedural complication and remained hemodynamically stable
throughout the procedure. The patient was escorted to have an
upright chest radiograph.
IMPRESSION: 1. Technically successful CT guided core needle core biopsy of
indeterminate nodule within the left upper lobe.
2. Procedure complicated by development of an expanding left-sided
pneumothorax requiring chest tube placement.

## 2018-05-17 IMAGING — DX DG CHEST 2V
2 series · 2 of 2 positions shown · non-contrast
Comparison: 08/02/2016

CLINICAL DATA: History of pneumothorax eft reason lung biopsy.
Shortness of breath.

EXAM:
CHEST  2 VIEW

[dg chest 2 view (1 of 2)]
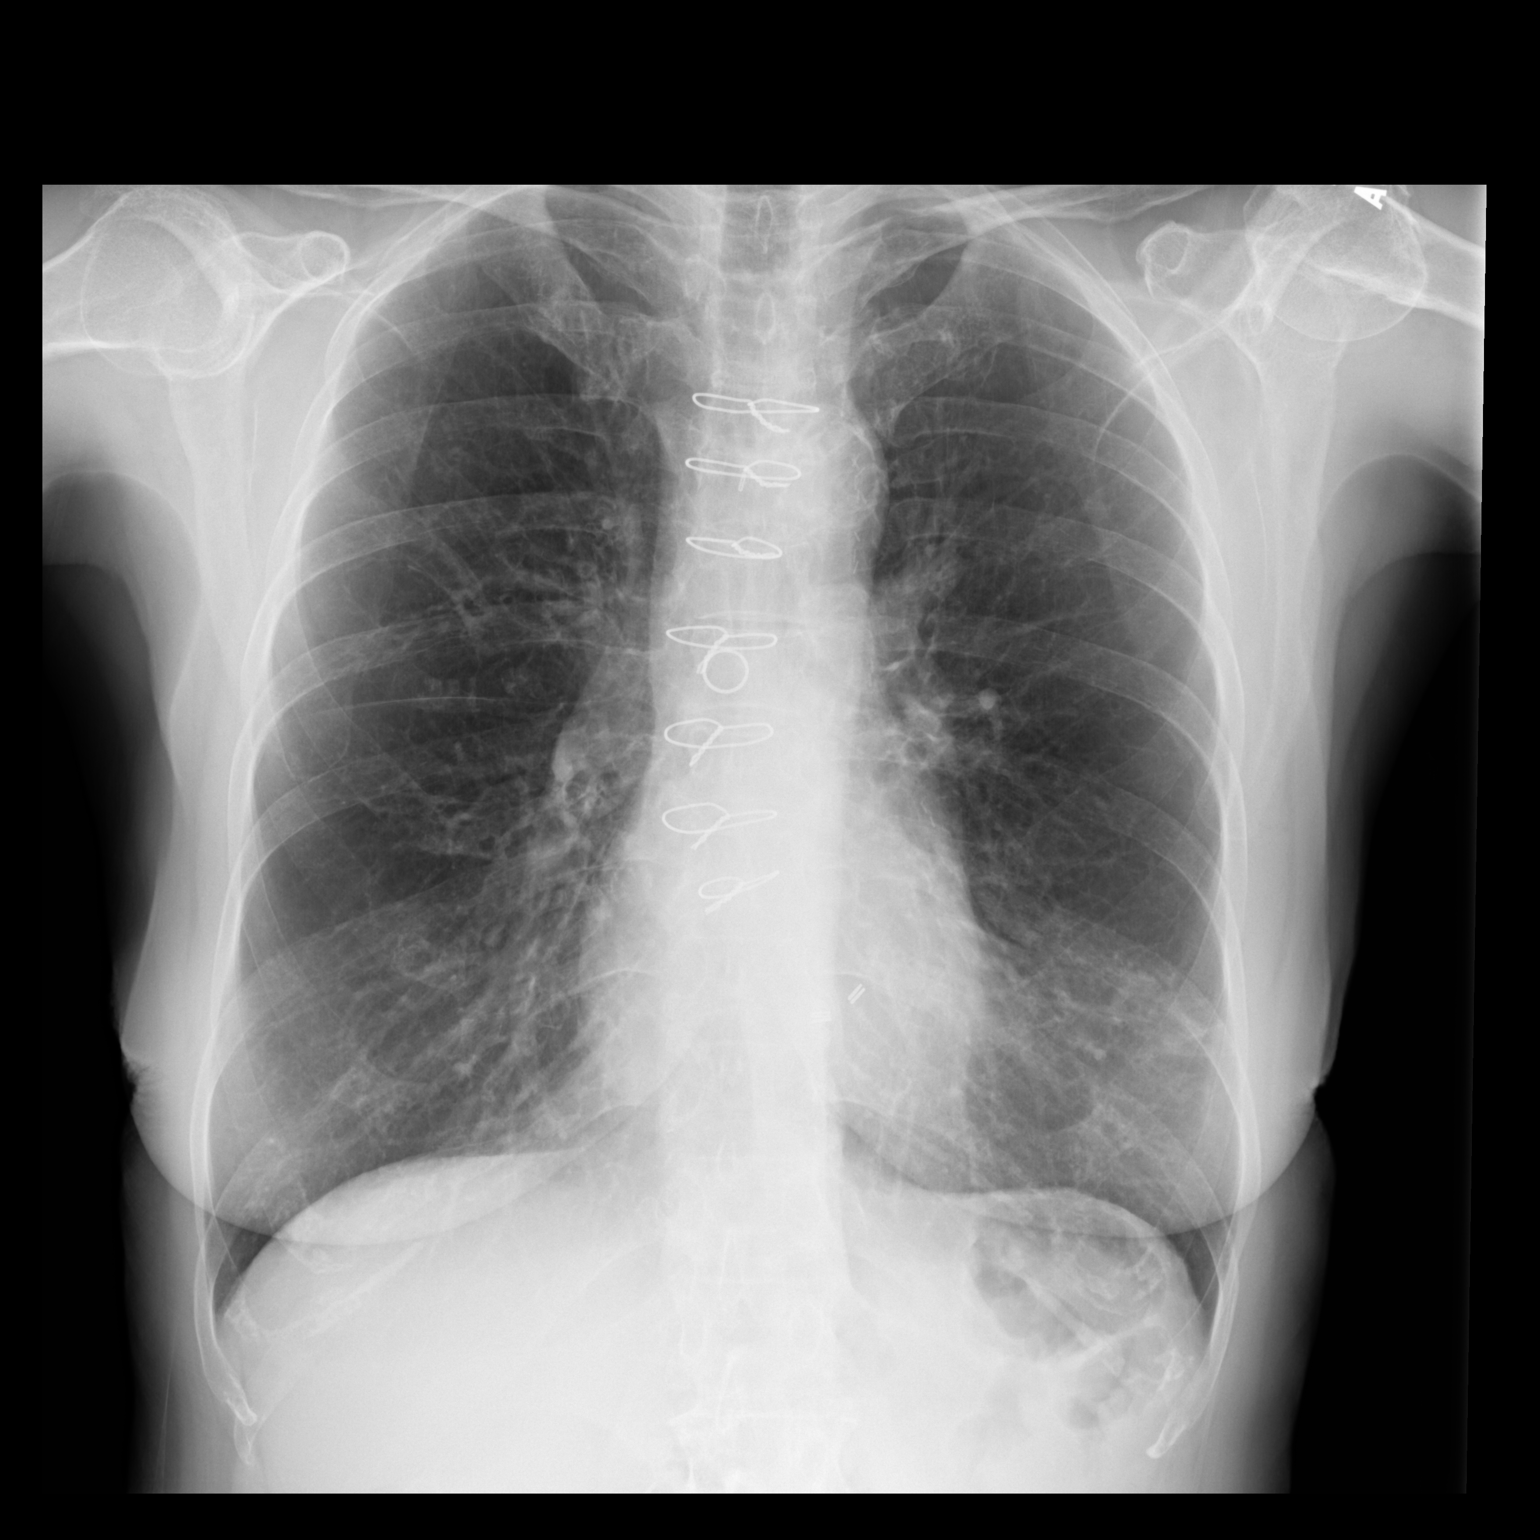

[dg chest 2 view (2 of 2)]
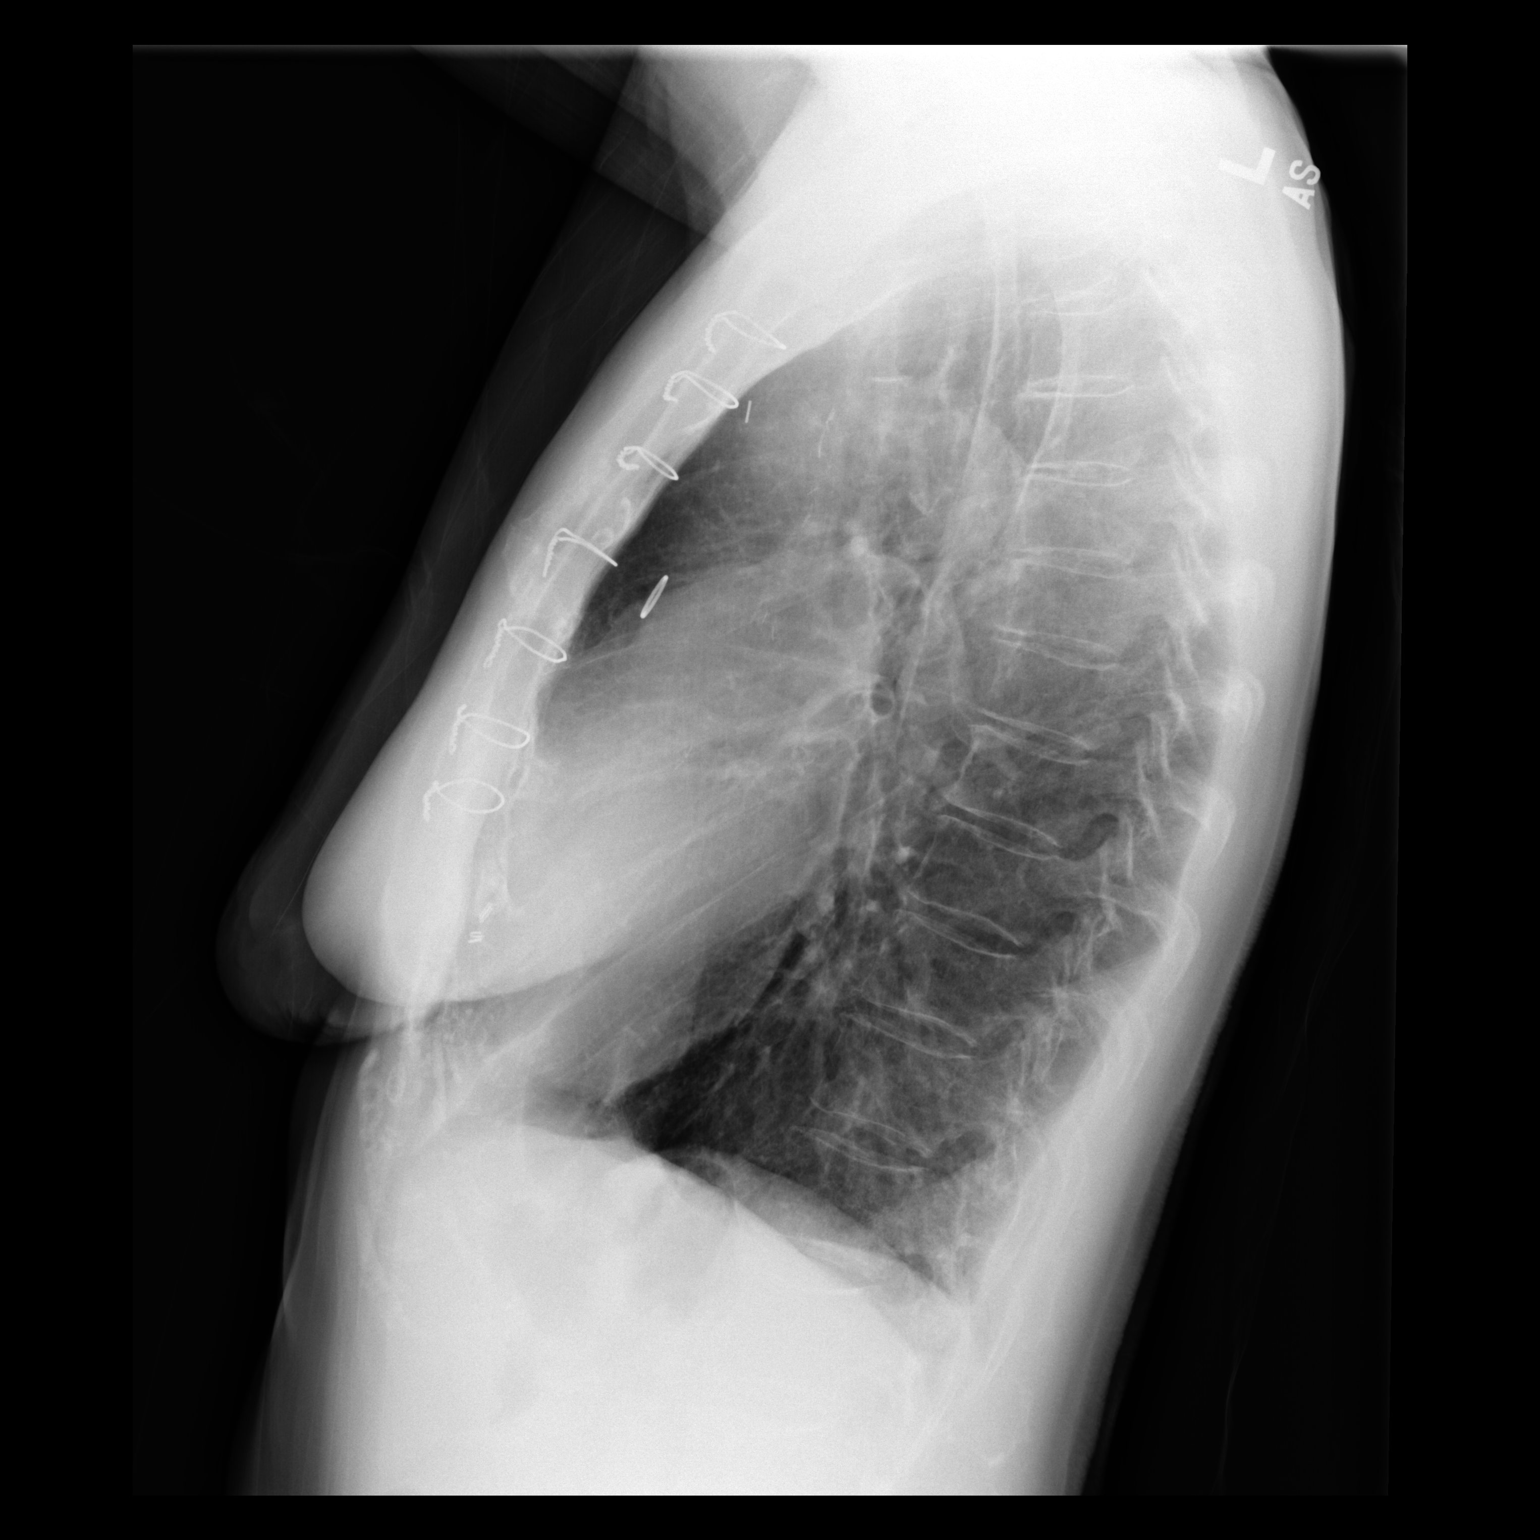

[2 of 2 positions shown; findings below may reference images not displayed]

FINDINGS: Residual atelectasis or scarring at the left base. No pneumothorax.
Mild hyperinflation. Prior CABG. Heart is normal size. No effusions
or acute bony abnormality.
IMPRESSION: Residual left base scarring or atelectasis.  No pneumothorax.

COPD.

## 2022-03-29 ENCOUNTER — Other Ambulatory Visit (HOSPITAL_COMMUNITY): Payer: Self-pay

## 2022-03-30 ENCOUNTER — Other Ambulatory Visit (HOSPITAL_COMMUNITY): Payer: Self-pay

## 2022-03-30 MED ORDER — DONEPEZIL HCL 23 MG PO TABS: 23.0000 mg | ORAL_TABLET | Freq: Every day | ORAL | 11 refills | Status: AC

## 2022-03-30 MED ORDER — BUPROPION HCL ER (SR) 150 MG PO TB12: 150.0000 mg | ORAL_TABLET | Freq: Two times a day (BID) | ORAL | 5 refills | Status: AC

## 2022-03-30 MED ORDER — BUDESONIDE-FORMOTEROL FUMARATE 160-4.5 MCG/ACT IN AERO: 2.0000 | INHALATION_SPRAY | Freq: Two times a day (BID) | RESPIRATORY_TRACT | 3 refills | Status: AC

## 2022-03-30 MED ORDER — BENZONATATE 200 MG PO CAPS: 200.0000 mg | ORAL_CAPSULE | Freq: Three times a day (TID) | ORAL | 1 refills | Status: AC | PRN

## 2022-04-01 ENCOUNTER — Other Ambulatory Visit (HOSPITAL_COMMUNITY): Payer: Self-pay

## 2022-05-07 ENCOUNTER — Other Ambulatory Visit (HOSPITAL_COMMUNITY): Payer: Self-pay

## 2022-05-10 ENCOUNTER — Other Ambulatory Visit (HOSPITAL_COMMUNITY): Payer: Self-pay

## 2022-05-14 ENCOUNTER — Other Ambulatory Visit (HOSPITAL_COMMUNITY): Payer: Self-pay

## 2022-05-14 MED ORDER — SYMBICORT 160-4.5 MCG/ACT IN AERO
2.0000 | INHALATION_SPRAY | Freq: Two times a day (BID) | RESPIRATORY_TRACT | 11 refills | Status: DC
  Filled 2022-05-14: qty 10.2, 30d supply, fill #0

## 2022-05-15 ENCOUNTER — Other Ambulatory Visit: Payer: Self-pay

## 2022-05-15 ENCOUNTER — Other Ambulatory Visit (HOSPITAL_COMMUNITY): Payer: Self-pay

## 2022-05-16 ENCOUNTER — Other Ambulatory Visit (HOSPITAL_COMMUNITY): Payer: Self-pay

## 2022-05-16 ENCOUNTER — Other Ambulatory Visit: Payer: Self-pay

## 2022-05-23 ENCOUNTER — Other Ambulatory Visit (HOSPITAL_COMMUNITY): Payer: Self-pay

## 2022-05-23 MED ORDER — LISINOPRIL 30 MG PO TABS
30.0000 mg | ORAL_TABLET | Freq: Every day | ORAL | 3 refills | Status: DC
  Filled 2022-05-23 – 2022-06-04 (×2): qty 90, 90d supply, fill #0
  Filled 2022-09-26: qty 90, 90d supply, fill #1

## 2022-06-04 ENCOUNTER — Other Ambulatory Visit (HOSPITAL_COMMUNITY): Payer: Self-pay

## 2022-06-04 ENCOUNTER — Other Ambulatory Visit: Payer: Self-pay

## 2022-06-04 MED ORDER — BUPROPION HCL ER (SR) 150 MG PO TB12
150.0000 mg | ORAL_TABLET | Freq: Two times a day (BID) | ORAL | 1 refills | Status: DC
  Filled 2022-06-04 (×2): qty 180, 90d supply, fill #0
  Filled 2022-11-08 – 2022-12-20 (×2): qty 180, 90d supply, fill #1

## 2022-06-13 ENCOUNTER — Other Ambulatory Visit (HOSPITAL_COMMUNITY): Payer: Self-pay

## 2022-06-27 ENCOUNTER — Other Ambulatory Visit (HOSPITAL_COMMUNITY): Payer: Self-pay

## 2022-06-27 MED ORDER — OMEPRAZOLE 40 MG PO CPDR
40.0000 mg | DELAYED_RELEASE_CAPSULE | Freq: Every day | ORAL | 3 refills | Status: DC
  Filled 2022-06-27 (×2): qty 90, 90d supply, fill #0

## 2022-06-28 ENCOUNTER — Other Ambulatory Visit (HOSPITAL_COMMUNITY): Payer: Self-pay

## 2022-06-28 ENCOUNTER — Other Ambulatory Visit: Payer: Self-pay

## 2022-07-03 ENCOUNTER — Other Ambulatory Visit (HOSPITAL_COMMUNITY): Payer: Self-pay

## 2022-07-03 MED ORDER — OMEPRAZOLE 40 MG PO CPDR
40.0000 mg | DELAYED_RELEASE_CAPSULE | Freq: Every day | ORAL | 3 refills | Status: DC
  Filled 2022-07-03 – 2022-12-20 (×3): qty 90, 90d supply, fill #0

## 2022-08-09 ENCOUNTER — Other Ambulatory Visit (HOSPITAL_COMMUNITY): Payer: Self-pay

## 2022-09-26 ENCOUNTER — Other Ambulatory Visit (HOSPITAL_COMMUNITY): Payer: Self-pay

## 2022-10-10 ENCOUNTER — Other Ambulatory Visit (HOSPITAL_COMMUNITY): Payer: Self-pay

## 2022-11-08 ENCOUNTER — Other Ambulatory Visit (HOSPITAL_COMMUNITY): Payer: Self-pay

## 2022-11-08 ENCOUNTER — Other Ambulatory Visit: Payer: Self-pay

## 2022-11-08 MED ORDER — ESCITALOPRAM OXALATE 10 MG PO TABS
10.0000 mg | ORAL_TABLET | Freq: Every day | ORAL | 1 refills | Status: DC
  Filled 2022-11-08: qty 90, 90d supply, fill #0

## 2022-11-08 MED ORDER — METOPROLOL SUCCINATE ER 50 MG PO TB24
50.0000 mg | ORAL_TABLET | Freq: Every day | ORAL | 1 refills | Status: DC
  Filled 2022-11-08 – 2022-12-20 (×2): qty 90, 90d supply, fill #0

## 2022-11-08 MED ORDER — ROSUVASTATIN CALCIUM 40 MG PO TABS
40.0000 mg | ORAL_TABLET | Freq: Every day | ORAL | 1 refills | Status: DC
  Filled 2022-11-08: qty 90, 90d supply, fill #0

## 2022-11-08 MED ORDER — LEVOTHYROXINE SODIUM 88 MCG PO TABS
88.0000 ug | ORAL_TABLET | Freq: Every morning | ORAL | 1 refills | Status: DC
  Filled 2022-11-08: qty 90, 90d supply, fill #0

## 2022-11-09 ENCOUNTER — Other Ambulatory Visit (HOSPITAL_COMMUNITY): Payer: Self-pay

## 2022-11-11 ENCOUNTER — Other Ambulatory Visit: Payer: Self-pay

## 2022-11-13 ENCOUNTER — Other Ambulatory Visit: Payer: Self-pay

## 2022-12-20 ENCOUNTER — Other Ambulatory Visit: Payer: Self-pay

## 2022-12-20 ENCOUNTER — Other Ambulatory Visit (HOSPITAL_COMMUNITY): Payer: Self-pay

## 2022-12-21 ENCOUNTER — Other Ambulatory Visit (HOSPITAL_COMMUNITY): Payer: Self-pay

## 2022-12-21 MED ORDER — DONEPEZIL HCL 10 MG PO TABS
10.0000 mg | ORAL_TABLET | Freq: Every day | ORAL | 0 refills | Status: DC
  Filled 2022-12-21: qty 90, 90d supply, fill #0

## 2022-12-21 MED ORDER — MEMANTINE HCL 10 MG PO TABS
10.0000 mg | ORAL_TABLET | Freq: Two times a day (BID) | ORAL | 0 refills | Status: AC
  Filled 2022-12-21: qty 180, 90d supply, fill #0

## 2022-12-23 ENCOUNTER — Other Ambulatory Visit: Payer: Self-pay

## 2022-12-23 ENCOUNTER — Other Ambulatory Visit (HOSPITAL_COMMUNITY): Payer: Self-pay

## 2022-12-24 ENCOUNTER — Other Ambulatory Visit: Payer: Self-pay

## 2023-01-22 ENCOUNTER — Other Ambulatory Visit (HOSPITAL_COMMUNITY): Payer: Self-pay
